# Patient Record
Sex: Male | Born: 1985 | Race: White | Hispanic: No | Marital: Single | Smoking: Current every day smoker
Health system: Southern US, Community
[De-identification: ages and names within clinical notes are randomized; demographics above are authoritative.]

## PROBLEM LIST (undated history)

## (undated) HISTORY — PX: PATELLA FRACTURE SURGERY: SHX735

## (undated) HISTORY — PX: SKIN GRAFT: SHX250

---

## 2016-12-13 DIAGNOSIS — K7011 Alcoholic hepatitis with ascites: Secondary | ICD-10-CM | POA: Insufficient documentation

## 2016-12-16 DIAGNOSIS — F1020 Alcohol dependence, uncomplicated: Secondary | ICD-10-CM | POA: Insufficient documentation

## 2018-05-23 ENCOUNTER — Emergency Department
Admission: EM | Admit: 2018-05-23 | Discharge: 2018-05-23 | Disposition: A | Discharge status: Home / Self Care | Payer: 59 | Source: Home / Self Care

## 2018-05-23 ENCOUNTER — Encounter: Payer: Self-pay | Admitting: Family Medicine

## 2018-05-23 ENCOUNTER — Ambulatory Visit (INDEPENDENT_AMBULATORY_CARE_PROVIDER_SITE_OTHER): Payer: 59

## 2018-05-23 ENCOUNTER — Emergency Department: Payer: 59

## 2018-05-23 ENCOUNTER — Other Ambulatory Visit: Payer: Self-pay

## 2018-05-23 ENCOUNTER — Other Ambulatory Visit (HOSPITAL_BASED_OUTPATIENT_CLINIC_OR_DEPARTMENT_OTHER): Payer: Self-pay | Admitting: Family Medicine

## 2018-05-23 ENCOUNTER — Other Ambulatory Visit: Payer: Self-pay | Admitting: Family Medicine

## 2018-05-23 DIAGNOSIS — N5089 Other specified disorders of the male genital organs: Secondary | ICD-10-CM | POA: Diagnosis not present

## 2018-05-23 DIAGNOSIS — N50812 Left testicular pain: Secondary | ICD-10-CM | POA: Diagnosis not present

## 2018-05-23 DIAGNOSIS — N50819 Testicular pain, unspecified: Secondary | ICD-10-CM

## 2018-05-23 LAB — POCT URINALYSIS DIP (MANUAL ENTRY)
Bilirubin, UA: NEGATIVE
Blood, UA: NEGATIVE
Glucose, UA: NEGATIVE mg/dL
Ketones, POC UA: NEGATIVE mg/dL
Leukocytes, UA: NEGATIVE
Nitrite, UA: NEGATIVE
Protein Ur, POC: NEGATIVE mg/dL
Spec Grav, UA: 1.015 (ref 1.010–1.025)
Urobilinogen, UA: 0.2 E.U./dL
pH, UA: 7.5 (ref 5.0–8.0)

## 2018-05-23 NOTE — Discharge Instructions (Addendum)
You need to be seen today by urologist.  My advice is to go to either the emergency room at [facility] or the emergency department at [facility] in [place].  We cannot tell whether this is a tumor or an infection in the testicle, but either way you need further evaluation and consideration for surgery.  This will get worse if you do not get treatment today.

## 2018-05-23 NOTE — ED Provider Notes (Signed)
Arthur Shelton C[facility]    CSN: [id number] Arrival date & time: 05/23/18  1306     History   Chief Complaint Chief Complaint  Patient presents with  . Testicle Pain    HPI Arthur Shelton is a 33 y.o. male.   This is a 33 year old man making his initial visit to [facility] urgent care.  Pt has had left testicular pain for about 5 days.  Spread to left lower back and radiated down left thigh.  Denies concern for STD.  He does have some back pain bilaterally now.  He was tested for STDs 3 months ago and nothing came back positive.  He has had no sex since then.  Patient is unemployed currently.  He has not been lifting anything heavy.  Patient has a history of alcoholism.     History reviewed. No pertinent past medical history.  There are no active problems to display for this patient.   History reviewed. No pertinent surgical history.     Home Medications    Prior to Admission medications   Medication Sig Start Date End Date Taking? Authorizing Provider  mirtazapine (REMERON) 15 MG tablet TAKE HALF A TABLET BY MOUTH EVREY NIGHT AT BEDTIME 12/11/17  Yes [provider]  naltrexone (DEPADE) 50 MG tablet Take by mouth. 11/27/17  Yes [provider]  omeprazole (PRILOSEC) 20 MG capsule Take by mouth daily. 04/29/18   [provider]  sertraline (ZOLOFT) 50 MG tablet Take 50 mg by mouth daily. 03/20/18   [provider]    Family History Family History  Problem Relation Age of Onset  . Kidney Stones Father   . Kidney Stones Sister     Social History Social History   Tobacco Use  . Smoking status: Not on file  Substance Use Topics  . Alcohol use: Not on file  . Drug use: Not on file     Allergies   Patient has no allergy information on record.   Review of Systems Review of Systems  Gastrointestinal: Negative for abdominal pain.  Genitourinary: Positive for flank pain and testicular pain. Negative for decreased urine  volume, difficulty urinating, discharge, dysuria, enuresis, frequency, genital sores, hematuria, penile pain, penile swelling, scrotal swelling and urgency.  Musculoskeletal: Positive for back pain.  All other systems reviewed and are negative.    Physical Exam Triage Vital Signs ED Triage Vitals  Enc Vitals Group     BP      Pulse      Resp      Temp      Temp src      SpO2      Weight      Height      Head Circumference      Peak Flow      Pain Score      Pain Loc      Pain Edu?      Excl. in [place]?    No data found.  Updated Vital Signs BP (!) 144/82 (BP Location: Right Arm)   Pulse 71   Temp 98 F (36.7 C) (Oral)   Resp 18   Ht 6\' 4"  (1.93 m)   Wt 87.5 kg   SpO2 100%   BMI 23.49 kg/m    Physical Exam Vitals signs and nursing note reviewed.  Constitutional:      Appearance: Normal appearance.  HENT:     Head: Normocephalic.     Mouth/Throat:     Mouth: Mucous membranes are  moist.  Eyes:     Conjunctiva/sclera: Conjunctivae normal.  Cardiovascular:     Rate and Rhythm: Normal rate and regular rhythm.  Pulmonary:     Effort: Pulmonary effort is normal.  Abdominal:     General: Abdomen is flat.     Palpations: Abdomen is soft.     Tenderness: There is no abdominal tenderness.     Hernia: No hernia is present.  Genitourinary:    Penis: Normal.      Comments: Exquisitely tender left testicle, particularly the anterior surface.  There is no swelling of either testicle.  There is no hernia.  There is no inguinal adenopathy. Musculoskeletal: Normal range of motion.        General: No swelling, tenderness, deformity or signs of injury.     Right lower leg: No edema.     Left lower leg: No edema.     Comments: Range of motion of back is normal  Skin:    General: Skin is warm and dry.  Neurological:     General: No focal deficit present.     Mental Status: He is alert and oriented to person, place, and time.  Psychiatric:        Mood and Affect: Mood  normal.      UC Treatments / Results  Labs (all labs ordered are listed, but only abnormal results are displayed) Labs Reviewed  POCT URINALYSIS DIP (MANUAL ENTRY)    EKG None  Radiology U/S of left testicle shows highly vascular mass. Procedures Procedures (including critical care time)  Medications Ordered in UC Medications - No data to display  Initial Impression / Assessment and Plan / UC Course  I have reviewed the triage vital signs and the nursing notes.  Pertinent labs & imaging results that were available during my care of the patient were reviewed by me and considered in my medical decision making (see chart for details).    Final Clinical Impressions(s) / UC Diagnoses   Final diagnoses:  Testicular mass     Discharge Instructions     You need to be seen today by urologist.  My advice is to go to either the emergency room at [facility] or the emergency department at [facility] in [place].  We cannot tell whether this is a tumor or an infection in the testicle, but either way you need further evaluation and consideration for surgery.  This will get worse if you do not get treatment today.    ED Prescriptions    None     Controlled Substance Prescriptions [place] Controlled Substance Registry consulted? Yes, I have consulted the  Controlled Substances Registry for this patient, and feel the risk/benefit ratio today is favorable for proceeding with this prescription for a controlled substance.   Robyn Haber, MD 05/23/18 1431

## 2018-05-23 NOTE — ED Triage Notes (Signed)
Pt has had testicular pain for about 5 days.  Spread to lower back and radiated down left thigh.  Denies concern for STD

## 2018-05-24 ENCOUNTER — Telehealth: Payer: Self-pay

## 2018-05-24 NOTE — Telephone Encounter (Signed)
Spoke with patient, he went to [facility] last night, and has a follow up with urology today.

## 2018-05-28 DIAGNOSIS — C6292 Malignant neoplasm of left testis, unspecified whether descended or undescended: Secondary | ICD-10-CM | POA: Insufficient documentation

## 2018-06-24 ENCOUNTER — Inpatient Hospital Stay (HOSPITAL_COMMUNITY)
Admission: EM | Admit: 2018-06-24 | Discharge: 2018-06-27 | DRG: 897 | Disposition: A | Discharge status: Home / Self Care | Payer: 59 | Attending: Internal Medicine | Admitting: Internal Medicine

## 2018-06-24 ENCOUNTER — Encounter (HOSPITAL_COMMUNITY): Payer: Self-pay | Admitting: Emergency Medicine

## 2018-06-24 DIAGNOSIS — F10230 Alcohol dependence with withdrawal, uncomplicated: Secondary | ICD-10-CM

## 2018-06-24 DIAGNOSIS — K7210 Chronic hepatic failure without coma: Secondary | ICD-10-CM | POA: Diagnosis present

## 2018-06-24 DIAGNOSIS — K709 Alcoholic liver disease, unspecified: Secondary | ICD-10-CM | POA: Diagnosis present

## 2018-06-24 DIAGNOSIS — F419 Anxiety disorder, unspecified: Secondary | ICD-10-CM | POA: Diagnosis not present

## 2018-06-24 DIAGNOSIS — D6959 Other secondary thrombocytopenia: Secondary | ICD-10-CM | POA: Diagnosis present

## 2018-06-24 DIAGNOSIS — Z8547 Personal history of malignant neoplasm of testis: Secondary | ICD-10-CM | POA: Insufficient documentation

## 2018-06-24 DIAGNOSIS — Z79899 Other long term (current) drug therapy: Secondary | ICD-10-CM

## 2018-06-24 DIAGNOSIS — Z841 Family history of disorders of kidney and ureter: Secondary | ICD-10-CM

## 2018-06-24 DIAGNOSIS — E876 Hypokalemia: Secondary | ICD-10-CM

## 2018-06-24 DIAGNOSIS — R7989 Other specified abnormal findings of blood chemistry: Secondary | ICD-10-CM | POA: Diagnosis present

## 2018-06-24 DIAGNOSIS — C6290 Malignant neoplasm of unspecified testis, unspecified whether descended or undescended: Secondary | ICD-10-CM | POA: Diagnosis present

## 2018-06-24 DIAGNOSIS — F10231 Alcohol dependence with withdrawal delirium: Principal | ICD-10-CM | POA: Diagnosis present

## 2018-06-24 DIAGNOSIS — R945 Abnormal results of liver function studies: Secondary | ICD-10-CM | POA: Diagnosis not present

## 2018-06-24 DIAGNOSIS — F10939 Alcohol use, unspecified with withdrawal, unspecified: Secondary | ICD-10-CM | POA: Diagnosis present

## 2018-06-24 DIAGNOSIS — F10239 Alcohol dependence with withdrawal, unspecified: Secondary | ICD-10-CM | POA: Diagnosis present

## 2018-06-24 DIAGNOSIS — K900 Celiac disease: Secondary | ICD-10-CM | POA: Diagnosis present

## 2018-06-24 DIAGNOSIS — K7030 Alcoholic cirrhosis of liver without ascites: Secondary | ICD-10-CM | POA: Diagnosis present

## 2018-06-24 DIAGNOSIS — K766 Portal hypertension: Secondary | ICD-10-CM | POA: Diagnosis present

## 2018-06-24 DIAGNOSIS — F10930 Alcohol use, unspecified with withdrawal, uncomplicated: Secondary | ICD-10-CM

## 2018-06-24 DIAGNOSIS — Z9079 Acquired absence of other genital organ(s): Secondary | ICD-10-CM

## 2018-06-24 LAB — COMPREHENSIVE METABOLIC PANEL
ALT: 100 U/L — ABNORMAL HIGH (ref 0–44)
AST: 231 U/L — ABNORMAL HIGH (ref 15–41)
Albumin: 4.1 g/dL (ref 3.5–5.0)
Alkaline Phosphatase: 145 U/L — ABNORMAL HIGH (ref 38–126)
Anion gap: 11 (ref 5–15)
BUN: 5 mg/dL — AB (ref 6–20)
CO2: 27 mmol/L (ref 22–32)
Calcium: 9.1 mg/dL (ref 8.9–10.3)
Chloride: 98 mmol/L (ref 98–111)
Creatinine, Ser: 0.52 mg/dL — ABNORMAL LOW (ref 0.61–1.24)
GFR calc non Af Amer: >60 mL/min (ref >60)
Glucose, Bld: 103 mg/dL — ABNORMAL HIGH (ref 70–99)
Potassium: 3.4 mmol/L — ABNORMAL LOW (ref 3.5–5.1)
Sodium: 136 mmol/L (ref 135–145)
Total Bilirubin: 2.9 mg/dL — ABNORMAL HIGH (ref 0.3–1.2)
Total Protein: 7.8 g/dL (ref 6.5–8.1)

## 2018-06-24 LAB — CBC
HCT: 39.5 % (ref 39.0–52.0)
Hemoglobin: 13.6 g/dL (ref 13.0–17.0)
MCH: 32.6 pg (ref 26.0–34.0)
MCHC: 34.4 g/dL (ref 30.0–36.0)
MCV: 94.7 fL (ref 80.0–100.0)
Platelets: 80 10*3/uL — ABNORMAL LOW (ref 150–400)
RBC: 4.17 MIL/uL — ABNORMAL LOW (ref 4.22–5.81)
RDW: 13.4 % (ref 11.5–15.5)
WBC: 7 10*3/uL (ref 4.0–10.5)
nRBC: 0 % (ref 0.0–0.2)

## 2018-06-24 LAB — MAGNESIUM: Magnesium: 1.4 mg/dL — ABNORMAL LOW (ref 1.7–2.4)

## 2018-06-24 LAB — ETHANOL: Alcohol, Ethyl (B): <10 mg/dL (ref <10)

## 2018-06-24 MED ORDER — LORAZEPAM 1 MG PO TABS
0.0000 mg | ORAL_TABLET | Freq: Four times a day (QID) | ORAL | Status: AC
  Administered 2018-06-24 (×2): 2 mg via ORAL
  Filled 2018-06-24 (×2): qty 2

## 2018-06-24 MED ORDER — VITAMIN B-1 100 MG PO TABS
100.0000 mg | ORAL_TABLET | Freq: Every day | ORAL | Status: DC
  Administered 2018-06-24 – 2018-06-27 (×4): 100 mg via ORAL
  Filled 2018-06-24 (×4): qty 1

## 2018-06-24 MED ORDER — SODIUM CHLORIDE 0.9 % IV SOLN
INTRAVENOUS | Status: DC
  Administered 2018-06-24: 09:00:00 via INTRAVENOUS

## 2018-06-24 MED ORDER — ONDANSETRON HCL 4 MG PO TABS: 4.0000 mg | ORAL_TABLET | Freq: Four times a day (QID) | ORAL | Status: DC | PRN

## 2018-06-24 MED ORDER — POTASSIUM CHLORIDE CRYS ER 20 MEQ PO TBCR
20.0000 meq | EXTENDED_RELEASE_TABLET | Freq: Two times a day (BID) | ORAL | Status: DC
  Administered 2018-06-24 – 2018-06-27 (×7): 20 meq via ORAL
  Filled 2018-06-24 (×7): qty 1

## 2018-06-24 MED ORDER — LORAZEPAM 2 MG/ML IJ SOLN: 0.0000 mg | Freq: Two times a day (BID) | INTRAMUSCULAR | Status: DC

## 2018-06-24 MED ORDER — ONDANSETRON HCL 4 MG/2ML IJ SOLN: 4.0000 mg | Freq: Four times a day (QID) | INTRAMUSCULAR | Status: DC | PRN

## 2018-06-24 MED ORDER — DIAZEPAM 5 MG PO TABS
5.0000 mg | ORAL_TABLET | Freq: Four times a day (QID) | ORAL | Status: DC
  Administered 2018-06-24 – 2018-06-25 (×5): 5 mg via ORAL
  Filled 2018-06-24 (×5): qty 1

## 2018-06-24 MED ORDER — LORAZEPAM 1 MG PO TABS: 0.0000 mg | ORAL_TABLET | Freq: Two times a day (BID) | ORAL | Status: DC

## 2018-06-24 MED ORDER — LORAZEPAM 2 MG/ML IJ SOLN
0.0000 mg | Freq: Four times a day (QID) | INTRAMUSCULAR | Status: AC
  Administered 2018-06-25: 2 mg via INTRAVENOUS
  Filled 2018-06-24 (×2): qty 1

## 2018-06-24 MED ORDER — THIAMINE HCL 100 MG/ML IJ SOLN
100.0000 mg | Freq: Every day | INTRAMUSCULAR | Status: DC
  Filled 2018-06-24: qty 2

## 2018-06-24 MED ORDER — LOPERAMIDE HCL 2 MG PO CAPS
2.0000 mg | ORAL_CAPSULE | Freq: Four times a day (QID) | ORAL | Status: DC | PRN
  Administered 2018-06-24 – 2018-06-27 (×2): 2 mg via ORAL
  Filled 2018-06-24 (×2): qty 1

## 2018-06-24 NOTE — ED Notes (Signed)
[facility] called for update.  Pt gave RN verbal permission to update facility which RN did.

## 2018-06-24 NOTE — ED Provider Notes (Addendum)
[place] EMERGENCY DEPARTMENT Provider Note   CSN: [id number] Arrival date & time: 06/24/18  0309    History   Chief Complaint Chief Complaint  Patient presents with  . Withdrawal    HPI Arthur Shelton is a 33 y.o. male.     33 y.o male with no pertinent no PMH presents to the ED sent from [facility] for possible withdrawals. Patient's last drink was Sunday 02/23 around 2pm states he had a glass of wine, prior to that patient had a bottle and a half of liquor throughout a week. He reports enrolling at [facility] yesterday began to experience some shakiness along with abdominal pain and states he was given Librium twice with his last dose at 9pm. Patient reports he began experience auditory and visual hallucinations and was told by RN on staff that he needed to be sent to the ED. Patient denies any SI, HI, chest pain.          Home Medications    Prior to Admission medications   Medication Sig Start Date End Date Taking? Authorizing Provider  mirtazapine (REMERON) 15 MG tablet TAKE HALF A TABLET BY MOUTH EVREY NIGHT AT BEDTIME 12/11/17   [provider]  naltrexone (DEPADE) 50 MG tablet Take by mouth. 11/27/17   [provider]  omeprazole (PRILOSEC) 20 MG capsule Take by mouth daily. 04/29/18   [provider]  sertraline (ZOLOFT) 50 MG tablet Take 50 mg by mouth daily. 03/20/18   [provider]    Family History Family History  Problem Relation Age of Onset  . Kidney Stones Father   . Kidney Stones Sister     Social History Social History   Tobacco Use  . Smoking status: Not on file  Substance Use Topics  . Alcohol use: Not on file  . Drug use: Not on file     Allergies   Patient has no known allergies.   Review of Systems Review of Systems  Constitutional: Negative for chills and fever.  HENT: Negative for ear pain and sore throat.   Eyes: Negative for pain and visual disturbance.    Respiratory: Negative for cough and shortness of breath.   Cardiovascular: Negative for chest pain and palpitations.  Gastrointestinal: Positive for abdominal pain. Negative for vomiting.  Genitourinary: Negative for dysuria and hematuria.  Musculoskeletal: Negative for arthralgias and back pain.  Skin: Negative for color change and rash.  Neurological: Negative for seizures and syncope.  Psychiatric/Behavioral: The patient is nervous/anxious.   All other systems reviewed and are negative.    Physical Exam Updated Vital Signs BP 137/88   Pulse 70   Temp 98.7 F (37.1 C) (Oral)   Resp 16   Ht 6\' 4"  (1.93 m)   Wt 90.7 kg   SpO2 97%   BMI 24.34 kg/m   Physical Exam Vitals signs and nursing note reviewed.  Constitutional:      Appearance: He is well-developed.  HENT:     Head: Normocephalic and atraumatic.  Eyes:     General: No scleral icterus.    Pupils: Pupils are equal, round, and reactive to light.  Neck:     Musculoskeletal: Normal range of motion.  Cardiovascular:     Heart sounds: Normal heart sounds.  Pulmonary:     Effort: Pulmonary effort is normal.     Breath sounds: Normal breath sounds. No wheezing.  Chest:     Chest wall: No tenderness.  Abdominal:  General: Bowel sounds are normal. There is no distension.     Palpations: Abdomen is soft.     Tenderness: There is abdominal tenderness.     Comments: Abdominal tenderness along the incision site.   Musculoskeletal:        General: No tenderness or deformity.  Skin:    General: Skin is warm and dry.  Neurological:     Mental Status: He is alert and oriented to person, place, and time.      ED Treatments / Results  Labs (all labs ordered are listed, but only abnormal results are displayed) Labs Reviewed  COMPREHENSIVE METABOLIC PANEL - Abnormal; Notable for the following components:      Result Value   Potassium 3.4 (*)    Glucose, Bld 103 (*)    BUN 5 (*)    Creatinine, Ser 0.52 (*)     AST 231 (*)    ALT 100 (*)    Alkaline Phosphatase 145 (*)    Total Bilirubin 2.9 (*)    All other components within normal limits  CBC - Abnormal; Notable for the following components:   RBC 4.17 (*)    Platelets 80 (*)    All other components within normal limits  ETHANOL  RAPID URINE DRUG SCREEN, HOSP PERFORMED    EKG None  Radiology No results found.  Procedures Procedures (including critical care time)  Medications Ordered in ED Medications  LORazepam (ATIVAN) injection 0-4 mg ( Intravenous See Alternative 06/24/18 0420)    Or  LORazepam (ATIVAN) tablet 0-4 mg (2 mg Oral Given 06/24/18 0420)  LORazepam (ATIVAN) injection 0-4 mg (has no administration in time range)    Or  LORazepam (ATIVAN) tablet 0-4 mg (has no administration in time range)  thiamine (VITAMIN B-1) tablet 100 mg (has no administration in time range)    Or  thiamine (B-1) injection 100 mg (has no administration in time range)     Initial Impression / Assessment and Plan / ED Course  I have reviewed the triage vital signs and the nursing notes.  Pertinent labs & imaging results that were available during my care of the patient were reviewed by me and considered in my medical decision making (see chart for details).      Patient presents from [facility] attempted to seek treatment on Sunday 23. Patient began experiencing some abdominal pain, sweats, visual and auditory hallucinations. He was given Librium x 2 but due to his symptoms persisting was sent to ED for stabilization.  During evaluation patient endorses some hallucinations along with headache and abdominal pain. CIWA score calculated at 12. Given Ativan x 2 oral, patient was moved to hallway bed but symptoms have not improved. He appears more anxious although hallucinations have resolved. He reports a previous admission due to alcohol withdrawal.   CMP showed slight decrease in K+. LFTs are elevated AST> ALT. CBC showed no leukocytosis,  hemoglobin is within normal limits. Ethanol level undetected.    5:58 AM Due to patient persist of symptoms will place call for hospitalist admission due alcohol withdrawal.   6:29 AM Spoke to Dr. Hal Hope who will have patient admitted to incoming provider.   Final Clinical Impressions(s) / ED Diagnoses   Final diagnoses:  Alcohol withdrawal syndrome without complication [facility])    ED Discharge Orders    None       Janeece Fitting, PA-C 06/24/18 0630    Orpah Greek, MD 06/24/18 2671    Janeece Fitting, PA-C 07/09/18  2337    Orpah Greek, MD 07/10/18 [phone]

## 2018-06-24 NOTE — ED Triage Notes (Signed)
Pt from [facility], last drink was 1pm on Sunday.  Pt is experiencing withdrawals and needed stabilization per facility.  Symptoms include headache, nausea, abdominal pain, chills, sweats and hallucinations.

## 2018-06-24 NOTE — ED Notes (Signed)
Pt continues to show signs of withdrawals, tremors, anxious and generally poor feeling. Pt moved back into room and provider notified and bedside.

## 2018-06-24 NOTE — H&P (Signed)
History and Physical    Arthur Shelton EYC:[id number] DOB: May 27, 1985 DOA: 06/24/2018  PCP: Patient, No Pcp Per  Patient coming from: [facility]   I have personally briefly reviewed patient's old medical records available.   Chief Complaint: hallucination  HPI: Arthur Shelton is a 33 y.o. 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  Patient has alcoholism for more than 5 years.  He drinks Liquor rum 3-5 drinks every day and more than that.  He had tried some rehab in the past.  [place].  By evening he started having hallucination that could not be managed there so he was sent to the ER.  According to the patient, he was having slight tremors and hallucination was most bothersome.  He was seeing things not there.  After arrival to ER he was given high-dose Ativan and currently feels normal.  Denies any nausea vomiting.  Denies any abdominal pain.  Denies any cramp.  Denies any headache, dizziness, syncopal episode.  Bowel and urine habits are normal.  No fever. ED Course: Hemodynamically stable.  Electrolytes normal except potassium of 3.4.  LFTs abnormal but at his baseline.  Patient was slightly agitated and hallucinating in the ER, he was given Ativan and currently Calm and quiet.  Hospitalization requested because of very high risk of withdrawals and delirium tremens, stabilization before sending back to [facility].  Review of Systems: As per HPI otherwise 10 point review of systems negative.    History reviewed. No pertinent past medical history.  Anxiety depression Alcoholic liver disease and abnormal liver function test Testicular  cancer  History reviewed. No pertinent surgical history. Radical orchiectomy left on 05/28/2018.  Social history Denies smoking 3-5 and occasionally more shots of rum every day for more than 5 years No Known Allergies  Family History  Problem Relation Age of Onset  . Kidney Stones Father   . Kidney Stones Sister      Prior to Admission medications   Medication Sig Start Date End Date Taking? Authorizing Provider  mirtazapine (REMERON) 15 MG tablet TAKE HALF A TABLET BY MOUTH EVREY NIGHT AT BEDTIME 12/11/17   [provider]  naltrexone (DEPADE) 50 MG tablet Take by mouth. 11/27/17   [provider]  omeprazole (PRILOSEC) 20 MG capsule Take by mouth daily. 04/29/18   [provider]  sertraline (ZOLOFT) 50 MG tablet Take 50 mg by mouth daily. 03/20/18   [provider]    Physical Exam: Vitals:   06/24/18 0600 06/24/18 0615 06/24/18 0630 06/24/18 0645  BP: 140/86 137/88 140/83 124/90  Pulse: 72 70 78 70  Resp:      Temp:      TempSrc:      SpO2: 97% 97% 100% 97%  Weight:      Height:        Constitutional: NAD, calm, comfortable Vitals:   06/24/18 0600 06/24/18 0615 06/24/18 0630 06/24/18 0645  BP: 140/86 137/88 140/83 124/90  Pulse: 72 70 78 70  Resp:      Temp:      TempSrc:      SpO2: 97% 97% 100% 97%  Weight:      Height:       Eyes: PERRL, lids and conjunctivae normal ENMT:  Mucous membranes are moist. Posterior pharynx clear of any exudate or lesions.Normal dentition.  Neck: normal, supple, no masses, no thyromegaly Respiratory: clear to auscultation bilaterally, no wheezing, no crackles. Normal respiratory effort. No accessory muscle use.  Cardiovascular: Regular rate and rhythm, no murmurs / rubs / gallops. No extremity edema. 2+ pedal pulses. No carotid bruits.  Abdomen: no tenderness, no masses palpated. No hepatosplenomegaly. Bowel sounds positive.  Musculoskeletal: no clubbing / cyanosis. No joint deformity upper and  lower extremities. Good ROM, no contractures. Normal muscle tone.  Skin: no rashes, lesions, ulcers. No induration Neurologic: CN 2-12 grossly intact. Sensation intact, DTR normal. Strength 5/5 in all 4.  Psychiatric: Normal judgment and insight. Alert and oriented x 3. Normal mood.  Currently denies any hallucinations, delusions.  Denies any suicidal or homicidal ideation.   Labs on Admission: I have personally reviewed following labs and imaging studies  CBC: Recent Labs  Lab 06/24/18 0318  WBC 7.0  HGB 13.6  HCT 39.5  MCV 94.7  PLT 80*   Basic Metabolic Panel: Recent Labs  Lab 06/24/18 0318  NA 136  K 3.4*  CL 98  CO2 27  GLUCOSE 103*  BUN 5*  CREATININE 0.52*  CALCIUM 9.1   GFR: Estimated Creatinine Clearance: 165.8 mL/min (A) (by C-G formula based on SCr of 0.52 mg/dL (L)). Liver Function Tests: Recent Labs  Lab 06/24/18 0318  AST 231*  ALT 100*  ALKPHOS 145*  BILITOT 2.9*  PROT 7.8  ALBUMIN 4.1   No results for input(s): LIPASE, AMYLASE in the last 168 hours. No results for input(s): AMMONIA in the last 168 hours. Coagulation Profile: No results for input(s): INR, PROTIME in the last 168 hours. Cardiac Enzymes: No results for input(s): CKTOTAL, CKMB, CKMBINDEX, TROPONINI in the last 168 hours. BNP (last 3 results) No results for input(s): PROBNP in the last 8760 hours. HbA1C: No results for input(s): HGBA1C in the last 72 hours. CBG: No results for input(s): GLUCAP in the last 168 hours. Lipid Profile: No results for input(s): CHOL, HDL, LDLCALC, TRIG, CHOLHDL, LDLDIRECT in the last 72 hours. Thyroid Function Tests: No results for input(s): TSH, T4TOTAL, FREET4, T3FREE, THYROIDAB in the last 72 hours. Anemia Panel: No results for input(s): VITAMINB12, FOLATE, FERRITIN, TIBC, IRON, RETICCTPCT in the last 72 hours. Urine analysis:    Component Value Date/Time   BILIRUBINUR negative 05/23/2018 1330   KETONESUR negative 05/23/2018 1330   PROTEINUR  negative 05/23/2018 1330   UROBILINOGEN 0.2 05/23/2018 1330   NITRITE Negative 05/23/2018 1330   LEUKOCYTESUR Negative 05/23/2018 1330    Radiological Exams on Admission: No results found.    Assessment/Plan Principal Problem:   Alcohol withdrawal (HCC) Active Problems:   Hypokalemia   Abnormal LFTs   Alcoholic liver disease (HCC)   Celiac disease   Anxiety     1. Alcohol withdrawal with high risk of delirium tremens: Agree with admission given severity of symptoms.  Patient is very high risk of developing delirium tremens.  Patient will be started on aggressive benzodiazepine therapy with IV and oral Ativan.  Multivitamins and thiamine.  Will start patient on scheduled doses of benzodiazepine with hope to taper every day that will help reduce the dose of as needed medications.  Fall precautions.  Seizure precautions.  2.  Hypokalemia: Replace and monitor levels.  Will check magnesium.  3.  Chronic liver disease/chronic alcoholic liver disease/abnormal LFTs: At about his baseline.  Currently no evidence of decompensation.  He was followed by GI  at [facility].  4.  Testicular cancer: Status post left radical orchiectomy.  Followed by urology at [facility].  He will follow-up with them.  Currently improved as per patient.   DVT prophylaxis: SCDs Code Status: Full code Family Communication: No family at bedside Disposition Plan: Back to [facility] Consults called: None Admission status: Observation telemetry   Barb Merino MD [facility] Pager [phone]  If 7PM-7AM, please contact night-coverage [email or web address] Password [facility]  06/24/2018, 7:41 AM

## 2018-06-25 DIAGNOSIS — K709 Alcoholic liver disease, unspecified: Secondary | ICD-10-CM | POA: Diagnosis not present

## 2018-06-25 DIAGNOSIS — F10230 Alcohol dependence with withdrawal, uncomplicated: Secondary | ICD-10-CM | POA: Diagnosis not present

## 2018-06-25 DIAGNOSIS — R945 Abnormal results of liver function studies: Secondary | ICD-10-CM | POA: Diagnosis not present

## 2018-06-25 DIAGNOSIS — F10231 Alcohol dependence with withdrawal delirium: Secondary | ICD-10-CM | POA: Diagnosis not present

## 2018-06-25 LAB — COMPREHENSIVE METABOLIC PANEL
ALT: 93 U/L — ABNORMAL HIGH (ref 0–44)
AST: 194 U/L — ABNORMAL HIGH (ref 15–41)
Albumin: 3.4 g/dL — ABNORMAL LOW (ref 3.5–5.0)
Alkaline Phosphatase: 128 U/L — ABNORMAL HIGH (ref 38–126)
Anion gap: 9 (ref 5–15)
BUN: 5 mg/dL — ABNORMAL LOW (ref 6–20)
CO2: 25 mmol/L (ref 22–32)
Calcium: 9 mg/dL (ref 8.9–10.3)
Chloride: 104 mmol/L (ref 98–111)
Creatinine, Ser: 0.56 mg/dL — ABNORMAL LOW (ref 0.61–1.24)
GFR calc Af Amer: >60 mL/min (ref >60)
GFR calc non Af Amer: >60 mL/min (ref >60)
Glucose, Bld: 85 mg/dL (ref 70–99)
Potassium: 3.5 mmol/L (ref 3.5–5.1)
SODIUM: 138 mmol/L (ref 135–145)
Total Bilirubin: 3.4 mg/dL — ABNORMAL HIGH (ref 0.3–1.2)
Total Protein: 6.6 g/dL (ref 6.5–8.1)

## 2018-06-25 LAB — CBC
HCT: 35.3 % — ABNORMAL LOW (ref 39.0–52.0)
Hemoglobin: 12 g/dL — ABNORMAL LOW (ref 13.0–17.0)
MCH: 32 pg (ref 26.0–34.0)
MCHC: 34 g/dL (ref 30.0–36.0)
MCV: 94.1 fL (ref 80.0–100.0)
Platelets: 62 10*3/uL — ABNORMAL LOW (ref 150–400)
RBC: 3.75 MIL/uL — ABNORMAL LOW (ref 4.22–5.81)
RDW: 13.2 % (ref 11.5–15.5)
WBC: 4.2 10*3/uL (ref 4.0–10.5)
nRBC: 0 % (ref 0.0–0.2)

## 2018-06-25 LAB — PROTIME-INR
INR: 1.4 — ABNORMAL HIGH (ref 0.8–1.2)
Prothrombin Time: 17.2 seconds — ABNORMAL HIGH (ref 11.4–15.2)

## 2018-06-25 LAB — HIV ANTIBODY (ROUTINE TESTING W REFLEX): HIV Screen 4th Generation wRfx: NONREACTIVE

## 2018-06-25 LAB — AMMONIA: Ammonia: 55 umol/L — ABNORMAL HIGH (ref 9–35)

## 2018-06-25 MED ORDER — MAGNESIUM SULFATE 2 GM/50ML IV SOLN
2.0000 g | Freq: Once | INTRAVENOUS | Status: AC
  Administered 2018-06-26: 2 g via INTRAVENOUS
  Filled 2018-06-25: qty 50

## 2018-06-25 MED ORDER — HEPARIN SODIUM (PORCINE) 5000 UNIT/ML IJ SOLN
5000.0000 [IU] | Freq: Three times a day (TID) | INTRAMUSCULAR | Status: DC
  Administered 2018-06-25: 5000 [IU] via SUBCUTANEOUS
  Filled 2018-06-25 (×2): qty 1

## 2018-06-25 MED ORDER — SODIUM CHLORIDE 0.9 % IV SOLN: INTRAVENOUS | Status: DC

## 2018-06-25 MED ORDER — POTASSIUM CHLORIDE 2 MEQ/ML IV SOLN
INTRAVENOUS | Status: AC
  Administered 2018-06-25 – 2018-06-26 (×3): via INTRAVENOUS
  Filled 2018-06-25 (×6): qty 1000

## 2018-06-25 MED ORDER — CHLORDIAZEPOXIDE HCL 5 MG PO CAPS
10.0000 mg | ORAL_CAPSULE | Freq: Three times a day (TID) | ORAL | Status: DC
  Administered 2018-06-25 – 2018-06-26 (×4): 10 mg via ORAL
  Filled 2018-06-25 (×4): qty 2

## 2018-06-25 MED ORDER — LACTULOSE 10 GM/15ML PO SOLN
10.0000 g | Freq: Two times a day (BID) | ORAL | Status: DC
  Administered 2018-06-25 – 2018-06-26 (×3): 10 g via ORAL
  Filled 2018-06-25 (×5): qty 15

## 2018-06-25 NOTE — Progress Notes (Signed)
PROGRESS NOTE                                                                                                                                                                                                             Patient Demographics:    Arthur Shelton, is a 33 y.o. male, DOB - [birth year], BJS:[id number]  Admit date - 06/24/2018   Admitting Physician Rise Patience, MD  Outpatient Primary MD for the patient is Patient, No Pcp Per  LOS - 0  Chief Complaint  Patient presents with  . Withdrawal       Brief Narrative  Arthur Shelton is a 33 y.o. male with medical history significant of chronic alcoholism, alcoholic liver disease, chronic liver failure, portal hypertension previously requiring paracentesis, anxiety, recently diagnosed testicular cancer status post radical orchidectomy last month coming from a rehab center with hallucinations after not drinking alcohol for 24 hours, was diagnosed with DTs.   Subjective:    Zaion Hreha today has, No headache, No chest pain, No abdominal pain - No Nausea, No new weakness tingling or numbness, No Cough - SOB.     Assessment  & Plan :    Principal Problem:   Alcohol withdrawal ([place]) Active Problems:   Hypokalemia   Abnormal LFTs   Alcoholic liver disease (HCC)   Celiac disease   Anxiety   1. DTs - has drink on Sunday 2 days ago, currently placed on long-acting benzodiazepine along with CIWA protocol, counseled to quit, DTs have stabilized but still remains high risk for withdrawal over the next 2 days.  Continue gentle hydration monitor electrolytes.  He wants to go back to [facility] once DTs have resolved.  This facility cannot take the patient while he is in DTs.  2.  History of alcoholic cirrhosis.  Outpatient GI follow-up, counseled to quit, ammonia is borderline elevated will place him on lactulose, INR is mildly high suggesting some underlying liver dysfunction which appears to be mild at this  time.  3.  Testicular cancer.  Status post recent left radial orchiectomy.  Outpatient follow-up with urology at [facility] post discharge.  4.  Hypomagnesemia.  Replaced will monitor.    Family Communication  :  None  Code Status :  Full  [facility] Plan  [facility]  [facility] resolved  Consults  :  None  Procedures  :    DVT Prophylaxis  :  Heparin    Lab Results  Component Value Date   PLT 62 (L) 06/25/2018    Diet :  Diet Order            Diet regular Room service appropriate? Yes; Fluid consistency: Thin  Diet effective now               Inpatient Medications Scheduled Meds: . chlordiazePOXIDE  10 mg Oral TID  . lactulose  10 g Oral BID  . LORazepam  0-4 mg Intravenous Q6H   Or  . LORazepam  0-4 mg Oral Q6H  . [START ON 06/26/2018] LORazepam  0-4 mg Intravenous Q12H   Or  . [START ON 06/26/2018] LORazepam  0-4 mg Oral Q12H  . potassium chloride  20 mEq Oral BID  . thiamine  100 mg Oral Daily   Or  . thiamine  100 mg Intravenous Daily   Continuous Infusions: . sodium chloride    . magnesium sulfate 1 - 4 g bolus IVPB     PRN Meds:.loperamide, ondansetron **OR** ondansetron (ZOFRAN) IV  Antibiotics  :   Anti-infectives (From admission, onward)   None          Objective:   Vitals:   06/24/18 0818 06/24/18 1549 06/24/18 2243 06/25/18 0511  BP: 136/84 (!) 146/86 (!) 146/88 (!) 141/94  Pulse: 69 84 92 86  Resp: 18 18    Temp: 98.4 F (36.9 C) 98.4 F (36.9 C) 98.2 F (36.8 C) 97.6 F (36.4 C)  TempSrc: Oral  Oral Oral  SpO2: 97% 98% 99% 99%  Weight: 86.5 kg     Height: 6\' 4"  (1.93 m)       Wt Readings from Last 3 Encounters:  06/24/18 86.5 kg  05/23/18 87.5 kg     Intake/Output Summary (Last 24 hours) at 06/25/2018 0841 Last data filed at 06/24/2018 1211 Gross per 24 hour  Intake 120 ml  Output -  Net 120 ml     Physical Exam  Awake Alert, Oriented X 3, No new F.N deficits, Normal affect [place].AT,PERRAL Supple  Neck,No JVD, No cervical lymphadenopathy appriciated.  Symmetrical Chest wall movement, Good air movement bilaterally, CTAB RRR,No Gallops,Rubs or new Murmurs, No Parasternal Heave +ve B.Sounds, Abd Soft, No tenderness, No organomegaly appriciated, No rebound - guarding or rigidity. No Cyanosis, Clubbing or edema, No new Rash or bruise      Data Review:    CBC Recent Labs  Lab 06/24/18 0318 06/25/18 0508  WBC 7.0 4.2  HGB 13.6 12.0*  HCT 39.5 35.3*  PLT 80* 62*  MCV 94.7 94.1  MCH 32.6 32.0  MCHC 34.4 34.0  RDW 13.4 13.2    Chemistries  Recent Labs  Lab 06/24/18 0318 06/24/18 1004 06/25/18 0508  NA 136  --  138  K 3.4*  --  3.5  CL 98  --  104  CO2 27  --  25  GLUCOSE 103*  --  85  BUN 5*  --  5*  CREATININE 0.52*  --  0.56*  CALCIUM 9.1  --  9.0  MG  --  1.4*  --   AST 231*  --  194*  ALT 100*  --  93*  ALKPHOS 145*  --  128*  BILITOT 2.9*  --  3.4*   ------------------------------------------------------------------------------------------------------------------ No results for input(s): CHOL, HDL, LDLCALC, TRIG, CHOLHDL, LDLDIRECT in the last 72 hours.  No results found for: HGBA1C ------------------------------------------------------------------------------------------------------------------ No results for input(s): TSH, T4TOTAL, T3FREE, THYROIDAB in the last 72 hours.  Invalid input(s): FREET3 ------------------------------------------------------------------------------------------------------------------  No results for input(s): VITAMINB12, FOLATE, FERRITIN, TIBC, IRON, RETICCTPCT in the last 72 hours.  Coagulation profile Recent Labs  Lab 06/25/18 0645  INR 1.4*    No results for input(s): DDIMER in the last 72 hours.  Cardiac Enzymes No results for input(s): CKMB, TROPONINI, MYOGLOBIN in the last 168 hours.  Invalid input(s):  CK ------------------------------------------------------------------------------------------------------------------ No results found for: BNP  Micro Results No results found for this or any previous visit (from the past 240 hour(s)).  Radiology Reports No results found.  Time Spent in minutes  30   Lala Lund M.D on 06/25/2018 at 8:41 AM  To page go to [email or web address] - password [facility]

## 2018-06-25 NOTE — Progress Notes (Signed)
Patient is supposed to go back to [facility] after discharge.  Mom will be taking him from hospital to [place].  However, FH will still require a nurse to nurse report, please call report to [place] before d/c.  Also FH would like for [place] to send a summary of his treatment during hospital stay for they're records.  Thank you!

## 2018-06-25 NOTE — Evaluation (Signed)
Physical Therapy Evaluation and D/C Patient Details Name: Arthur Shelton MRN: [id number] DOB: 1985/06/01 Today's Date: 06/25/2018   History of Present Illness  Earmon Sherrow is a 33 y.o. male with medical history significant of chronic alcoholism, alcoholic liver disease, chronic liver failure, portal hypertension previously requiring paracentesis, anxiety, recently diagnosed testicular cancer status post radical orchidectomy last month coming from a rehab center with hallucinations after not drinking alcohol for 24 hours, was diagnosed with DTs.  Clinical Impression  Pt admitted with above diagnosis. Pt currently without significant functional limitations and was able to ambulate without assist and without device.  Scored 21/24 on DGI therefore not high fall risk.  Pt did not have any LOB with mod challenges. Will not need  skilled PT at this time. Will sign off.    Follow Up Recommendations No PT follow up    Equipment Recommendations  None recommended by PT    Recommendations for Other Services       Precautions / Restrictions Precautions Precautions: None Restrictions Weight Bearing Restrictions: No      Mobility  Bed Mobility Overal bed mobility: Independent                Transfers Overall transfer level: Independent                  Ambulation/Gait Ambulation/Gait assistance: Independent Gait Distance (Feet): 650 Feet Assistive device: None Gait Pattern/deviations: WFL(Within Functional Limits)   Gait velocity interpretation: >4.37 ft/sec, indicative of normal walking speed General Gait Details: No LOB with challenges.  Pt right LE at times did not take as big a step with right foot but did not cause any imbalance.   Stairs Stairs: Yes Stairs assistance: Modified independent (Device/Increase time) Stair Management: One rail Left;Forwards;Alternating pattern Number of Stairs: 4 General stair comments: No issues up and down steps  Wheelchair Mobility    Modified Rankin (Stroke Patients Only)       Balance Overall balance assessment: Needs assistance Sitting-balance support: No upper extremity supported;Feet supported Sitting balance-Leahy Scale: Fair     Standing balance support: No upper extremity supported;During functional activity Standing balance-Leahy Scale: Good                   Standardized Balance Assessment Standardized Balance Assessment : Dynamic Gait Index   Dynamic Gait Index Level Surface: Normal Change in Gait Speed: Normal Gait with Horizontal Head Turns: Mild Impairment Gait with Vertical Head Turns: Normal Gait and Pivot Turn: Mild Impairment Step Over Obstacle: Normal Step Around Obstacles: Normal Steps: Mild Impairment Total Score: 21       Pertinent Vitals/Pain Pain Assessment: No/denies pain    Home Living Family/patient expects to be discharged to:: Private residence Living Arrangements: Other (Comment)([facility]) Available Help at Discharge: Available PRN/intermittently Type of Home: Other(Comment)([facility]) Home Access: Stairs to enter Entrance Stairs-Rails: Psychiatric nurse of Steps: 4 Home Layout: One level Home Equipment: None      Prior Function Level of Independence: Independent               Hand Dominance        Extremity/Trunk Assessment   Upper Extremity Assessment Upper Extremity Assessment: Defer to OT evaluation    Lower Extremity Assessment Lower Extremity Assessment: Generalized weakness    Cervical / Trunk Assessment Cervical / Trunk Assessment: Normal  Communication   Communication: No difficulties  Cognition Arousal/Alertness: Awake/alert Behavior During Therapy: WFL for tasks assessed/performed Overall Cognitive Status: Within Functional  Limits for tasks assessed                                        General Comments      Exercises     Assessment/Plan    PT  Assessment Patent does not need any further PT services  PT Problem List         PT Treatment Interventions      PT Goals (Current goals can be found in the Care Plan section)  Acute Rehab PT Goals Patient Stated Goal: to go to [facility] to continue treatment PT Goal Formulation: All assessment and education complete, DC therapy    Frequency     Barriers to discharge        Co-evaluation               AM-PAC PT "6 Clicks" Mobility  Outcome Measure Help needed turning from your back to your side while in a flat bed without using bedrails?: None Help needed moving from lying on your back to sitting on the side of a flat bed without using bedrails?: None Help needed moving to and from a bed to a chair (including a wheelchair)?: None Help needed standing up from a chair using your arms (e.g., wheelchair or bedside chair)?: None Help needed to walk in hospital room?: None Help needed climbing 3-5 steps with a railing? : None 6 Click Score: 24    End of Session Equipment Utilized During Treatment: Gait belt Activity Tolerance: Patient tolerated treatment well Patient left: in bed;with call bell/phone within reach;with nursing/sitter in room Nurse Communication: Mobility status PT Visit Diagnosis: Muscle weakness (generalized) (M62.81)    Time: 9371-6967 PT Time Calculation (min) (ACUTE ONLY): 26 min   Charges:   PT Evaluation $PT Eval Moderate Complexity: 1 Mod PT Treatments $Gait Training: 8-22 mins        [place] Pager:  [phone]  Office:  [place] 06/25/2018, 10:41 AM

## 2018-06-26 DIAGNOSIS — K7030 Alcoholic cirrhosis of liver without ascites: Secondary | ICD-10-CM | POA: Diagnosis present

## 2018-06-26 DIAGNOSIS — K709 Alcoholic liver disease, unspecified: Secondary | ICD-10-CM | POA: Diagnosis not present

## 2018-06-26 DIAGNOSIS — Z841 Family history of disorders of kidney and ureter: Secondary | ICD-10-CM | POA: Diagnosis not present

## 2018-06-26 DIAGNOSIS — E876 Hypokalemia: Secondary | ICD-10-CM | POA: Diagnosis present

## 2018-06-26 DIAGNOSIS — K900 Celiac disease: Secondary | ICD-10-CM | POA: Diagnosis present

## 2018-06-26 DIAGNOSIS — Z79899 Other long term (current) drug therapy: Secondary | ICD-10-CM | POA: Diagnosis not present

## 2018-06-26 DIAGNOSIS — K766 Portal hypertension: Secondary | ICD-10-CM | POA: Diagnosis present

## 2018-06-26 DIAGNOSIS — Z9079 Acquired absence of other genital organ(s): Secondary | ICD-10-CM | POA: Diagnosis not present

## 2018-06-26 DIAGNOSIS — F10231 Alcohol dependence with withdrawal delirium: Secondary | ICD-10-CM | POA: Diagnosis present

## 2018-06-26 DIAGNOSIS — K7210 Chronic hepatic failure without coma: Secondary | ICD-10-CM | POA: Diagnosis present

## 2018-06-26 DIAGNOSIS — F419 Anxiety disorder, unspecified: Secondary | ICD-10-CM | POA: Diagnosis present

## 2018-06-26 DIAGNOSIS — D6959 Other secondary thrombocytopenia: Secondary | ICD-10-CM | POA: Diagnosis present

## 2018-06-26 DIAGNOSIS — D696 Thrombocytopenia, unspecified: Secondary | ICD-10-CM

## 2018-06-26 DIAGNOSIS — F10230 Alcohol dependence with withdrawal, uncomplicated: Secondary | ICD-10-CM | POA: Diagnosis present

## 2018-06-26 DIAGNOSIS — C6290 Malignant neoplasm of unspecified testis, unspecified whether descended or undescended: Secondary | ICD-10-CM | POA: Diagnosis present

## 2018-06-26 LAB — COMPREHENSIVE METABOLIC PANEL
ALT: 98 U/L — ABNORMAL HIGH (ref 0–44)
AST: 202 U/L — ABNORMAL HIGH (ref 15–41)
Albumin: 3.5 g/dL (ref 3.5–5.0)
Alkaline Phosphatase: 127 U/L — ABNORMAL HIGH (ref 38–126)
Anion gap: 11 (ref 5–15)
BILIRUBIN TOTAL: 2.4 mg/dL — AB (ref 0.3–1.2)
BUN: <5 mg/dL — ABNORMAL LOW (ref 6–20)
CO2: 23 mmol/L (ref 22–32)
CREATININE: 0.56 mg/dL — AB (ref 0.61–1.24)
Calcium: 8.9 mg/dL (ref 8.9–10.3)
Chloride: 102 mmol/L (ref 98–111)
GFR calc Af Amer: >60 mL/min (ref >60)
GFR calc non Af Amer: >60 mL/min (ref >60)
Glucose, Bld: 84 mg/dL (ref 70–99)
Potassium: 4 mmol/L (ref 3.5–5.1)
Sodium: 136 mmol/L (ref 135–145)
Total Protein: 6.8 g/dL (ref 6.5–8.1)

## 2018-06-26 LAB — MAGNESIUM: Magnesium: 2.1 mg/dL (ref 1.7–2.4)

## 2018-06-26 LAB — CBC
HCT: 34.8 % — ABNORMAL LOW (ref 39.0–52.0)
Hemoglobin: 12 g/dL — ABNORMAL LOW (ref 13.0–17.0)
MCH: 32.6 pg (ref 26.0–34.0)
MCHC: 34.5 g/dL (ref 30.0–36.0)
MCV: 94.6 fL (ref 80.0–100.0)
Platelets: 53 10*3/uL — ABNORMAL LOW (ref 150–400)
RBC: 3.68 MIL/uL — AB (ref 4.22–5.81)
RDW: 13.4 % (ref 11.5–15.5)
WBC: 4.7 10*3/uL (ref 4.0–10.5)
nRBC: 0 % (ref 0.0–0.2)

## 2018-06-26 MED ORDER — CHLORDIAZEPOXIDE HCL 5 MG PO CAPS
10.0000 mg | ORAL_CAPSULE | Freq: Two times a day (BID) | ORAL | Status: DC
  Administered 2018-06-26 – 2018-06-27 (×2): 10 mg via ORAL
  Filled 2018-06-26 (×2): qty 2

## 2018-06-26 MED ORDER — ADULT MULTIVITAMIN W/MINERALS CH
1.0000 | ORAL_TABLET | Freq: Every day | ORAL | Status: DC
  Administered 2018-06-26 – 2018-06-27 (×2): 1 via ORAL
  Filled 2018-06-26 (×2): qty 1

## 2018-06-26 MED ORDER — FOLIC ACID 1 MG PO TABS
1.0000 mg | ORAL_TABLET | Freq: Every day | ORAL | Status: DC
  Administered 2018-06-26 – 2018-06-27 (×2): 1 mg via ORAL
  Filled 2018-06-26 (×2): qty 1

## 2018-06-26 NOTE — Progress Notes (Signed)
Patient ID: Arthur Shelton, male   DOB: 1985-08-19, 33 y.o.   MRN: [id number]  PROGRESS NOTE    Arthur Shelton  VEH:[id number] DOB: Mar 13, 1986 DOA: 06/24/2018 PCP: Patient, No Pcp Per   Brief Narrative:  33 year old male with history of alcoholism, active liver disease, portal hypertension previously requiring paracentesis, anxiety, recently diagnosed testicular cancer status post radical orchidectomy last month presented from rehab center with hallucinations after not drinking alcohol for 24 hours and was diagnosed with DTs.  Assessment & Plan:   Principal Problem:   Alcohol withdrawal ([place]) Active Problems:   Hypokalemia   Abnormal LFTs   Alcoholic liver disease ([place])   Celiac disease   Anxiety   Delirium tremens/alcohol withdrawal in a patient with chronic alcohol abuse -Continue CIWA protocol.  Counseled to abstain from alcohol.  Continue current regimen.  Will add folic acid and multivitamin.  Probable discharge to [facility] detox center tomorrow if he remains clinically stable  History of alcoholic liver disease/cirrhosis -Ammonia level was borderline elevated.  Lactulose has been started.  Currently not on diuretics.  Outpatient follow-up with GI.  Testicular cancer status post recent left radical orchiectomy -Outpatient follow-up with urology at [facility] post discharge  Thrombocytopenia -From alcohol abuse and cirrhosis.  No signs of bleeding.  Monitor  Hypomagnesemia -Replaced.  Resolved.   DVT prophylaxis: Discontinue heparin because of thrombocytopenia.  Will start SCDs Code Status: Full Family Communication: None at bedside Disposition Plan: Discharge tomorrow to [facility] if remains stable  Consultants: None  Procedures: None  Antimicrobials: None   Subjective: Patient seen and examined at bedside.  He feels slightly better.  His tremors are improving.  No current hallucinations.  No overnight fever or vomiting.  Objective: Vitals:   06/25/18 1412  06/25/18 1413 06/25/18 2214 06/26/18 0551  BP:  135/88 (!) 137/92 128/83  Pulse:  92 79 79  Resp:  16 16 18   Temp: 97.9 F (36.6 C) 97.9 F (36.6 C) 98 F (36.7 C) (!) 97.4 F (36.3 C)  TempSrc: Oral  Oral   SpO2:  98% 99% 99%  Weight:      Height:        Intake/Output Summary (Last 24 hours) at 06/26/2018 1101 Last data filed at 06/26/2018 1044 Gross per 24 hour  Intake 360 ml  Output -  Net 360 ml   Filed Weights   06/24/18 0316 06/24/18 0818  Weight: 90.7 kg 86.5 kg    Examination:  General exam: Appears calm and comfortable, no distress. Respiratory system: Bilateral decreased breath sounds at bases Cardiovascular system: S1 & S2 heard, Rate controlled Gastrointestinal system: Abdomen is nondistended, soft and nontender. Normal bowel sounds heard. Extremities: No cyanosis, clubbing, edema   Data Reviewed: I have personally reviewed following labs and imaging studies  CBC: Recent Labs  Lab 06/24/18 0318 06/25/18 0508 06/26/18 0344  WBC 7.0 4.2 4.7  HGB 13.6 12.0* 12.0*  HCT 39.5 35.3* 34.8*  MCV 94.7 94.1 94.6  PLT 80* 62* 53*   Basic Metabolic Panel: Recent Labs  Lab 06/24/18 0318 06/24/18 1004 06/25/18 0508 06/26/18 0344  NA 136  --  138 136  K 3.4*  --  3.5 4.0  CL 98  --  104 102  CO2 27  --  25 23  GLUCOSE 103*  --  85 84  BUN 5*  --  5* <5*  CREATININE 0.52*  --  0.56* 0.56*  CALCIUM 9.1  --  9.0 8.9  MG  --  1.4*  --  2.1   GFR: Estimated Creatinine Clearance: 165.2 mL/min (A) (by C-G formula based on SCr of 0.56 mg/dL (L)). Liver Function Tests: Recent Labs  Lab 06/24/18 0318 06/25/18 0508 06/26/18 0344  AST 231* 194* 202*  ALT 100* 93* 98*  ALKPHOS 145* 128* 127*  BILITOT 2.9* 3.4* 2.4*  PROT 7.8 6.6 6.8  ALBUMIN 4.1 3.4* 3.5   No results for input(s): LIPASE, AMYLASE in the last 168 hours. Recent Labs  Lab 06/25/18 0645  AMMONIA 55*   Coagulation Profile: Recent Labs  Lab 06/25/18 0645  INR 1.4*   Cardiac  Enzymes: No results for input(s): CKTOTAL, CKMB, CKMBINDEX, TROPONINI in the last 168 hours. BNP (last 3 results) No results for input(s): PROBNP in the last 8760 hours. HbA1C: No results for input(s): HGBA1C in the last 72 hours. CBG: No results for input(s): GLUCAP in the last 168 hours. Lipid Profile: No results for input(s): CHOL, HDL, LDLCALC, TRIG, CHOLHDL, LDLDIRECT in the last 72 hours. Thyroid Function Tests: No results for input(s): TSH, T4TOTAL, FREET4, T3FREE, THYROIDAB in the last 72 hours. Anemia Panel: No results for input(s): VITAMINB12, FOLATE, FERRITIN, TIBC, IRON, RETICCTPCT in the last 72 hours. Sepsis Labs: No results for input(s): PROCALCITON, LATICACIDVEN in the last 168 hours.  No results found for this or any previous visit (from the past 240 hour(s)).       Radiology Studies: No results found.      Scheduled Meds: . chlordiazePOXIDE  10 mg Oral TID  . heparin injection (subcutaneous)  5,000 Units Subcutaneous Q8H  . lactulose  10 g Oral BID  . LORazepam  0-4 mg Intravenous Q12H   Or  . LORazepam  0-4 mg Oral Q12H  . potassium chloride  20 mEq Oral BID  . thiamine  100 mg Oral Daily   Or  . thiamine  100 mg Intravenous Daily   Continuous Infusions: . lactated ringers with kcl 75 mL/hr at 06/26/18 0021     LOS: 0 days        Aline August, MD [facility] 06/26/2018, 11:01 AM

## 2018-06-26 NOTE — Progress Notes (Signed)
PT Cancellation Note  Patient Details Name: Arthur Shelton MRN: [id number] DOB: 1986-03-15   Cancelled Treatment:    Reason Eval/Treat Not Completed: PT screened, no needs identified, will sign off. New PT eval received, chart reviewed. Note that pt was evaluated by PT 2/25 and did not require any further acute PT needs. Please see full evaluation for details. If needs change, please reconsult.   Thelma Comp 06/26/2018, 7:42 AM   Rolinda Roan, PT, DPT Acute Rehabilitation Services Pager: [phone] Office: [phone]

## 2018-06-26 NOTE — Care Management Note (Addendum)
Case Management Note  Patient Details  Name: Case Vassell MRN: [id number] Date of Birth: 05-Feb-1986  Subjective/Objective:  Admitted with alcohol withdrawal. Hx of   chronic alcoholism, alcoholic liver disease, chronic liver failure, portal hypertension,  paracentesis, anxiety,  testicular cancer status post radical orchidectomy. PTA  Independent with ADL's., no DME usage.        Jarod Bozzo (Mother)     [phone]      PCP: pt without PCP  Action/Plan: Transition back to [facility]. when medically stable.... CSW aware. No present needs identified per NCM.  Post hospital f/u appointment scheduled and noted on AVS per NCM. Pt states has transportation to [facility] once d/c.  Expected Discharge Date:                  Expected Discharge Plan:  Home/Self Care  In-House Referral:  Clinical Social Work  Discharge planning Services  CM Consult(home health needs)  Post Acute Care Choice:  NA Choice offered to:  NA  DME Arranged:  N/A DME Agency:  NA  HH Arranged:  NA HH Agency:  NA  Status of Service:  inprocess  If discussed at Long Length of Stay Meetings, dates discussed:    Additional Comments:  Sharin Mons, RN 06/26/2018, 3:28 PM

## 2018-06-27 LAB — CBC
HEMATOCRIT: 35.1 % — AB (ref 39.0–52.0)
Hemoglobin: 12.1 g/dL — ABNORMAL LOW (ref 13.0–17.0)
MCH: 32.7 pg (ref 26.0–34.0)
MCHC: 34.5 g/dL (ref 30.0–36.0)
MCV: 94.9 fL (ref 80.0–100.0)
Platelets: 71 10*3/uL — ABNORMAL LOW (ref 150–400)
RBC: 3.7 MIL/uL — ABNORMAL LOW (ref 4.22–5.81)
RDW: 13.4 % (ref 11.5–15.5)
WBC: 5.9 10*3/uL (ref 4.0–10.5)
nRBC: 0 % (ref 0.0–0.2)

## 2018-06-27 LAB — COMPREHENSIVE METABOLIC PANEL
ALT: 111 U/L — ABNORMAL HIGH (ref 0–44)
AST: 171 U/L — ABNORMAL HIGH (ref 15–41)
Albumin: 3.6 g/dL (ref 3.5–5.0)
Alkaline Phosphatase: 129 U/L — ABNORMAL HIGH (ref 38–126)
Anion gap: 12 (ref 5–15)
BILIRUBIN TOTAL: 1.4 mg/dL — AB (ref 0.3–1.2)
BUN: 7 mg/dL (ref 6–20)
CO2: 23 mmol/L (ref 22–32)
Calcium: 9 mg/dL (ref 8.9–10.3)
Chloride: 101 mmol/L (ref 98–111)
Creatinine, Ser: 0.58 mg/dL — ABNORMAL LOW (ref 0.61–1.24)
GFR calc Af Amer: >60 mL/min (ref >60)
Glucose, Bld: 98 mg/dL (ref 70–99)
Potassium: 3.8 mmol/L (ref 3.5–5.1)
Sodium: 136 mmol/L (ref 135–145)
Total Protein: 6.5 g/dL (ref 6.5–8.1)

## 2018-06-27 LAB — MAGNESIUM: MAGNESIUM: 1.8 mg/dL (ref 1.7–2.4)

## 2018-06-27 MED ORDER — LACTULOSE 10 GM/15ML PO SOLN: 10.0000 g | Freq: Two times a day (BID) | ORAL | 0 refills | Status: AC

## 2018-06-27 MED ORDER — FOLIC ACID 1 MG PO TABS: 1.0000 mg | ORAL_TABLET | Freq: Every day | ORAL | 0 refills | Status: AC

## 2018-06-27 MED ORDER — THIAMINE HCL 100 MG PO TABS: 100.0000 mg | ORAL_TABLET | Freq: Every day | ORAL | 0 refills | Status: AC

## 2018-06-27 MED ORDER — ADULT MULTIVITAMIN W/MINERALS CH: 1.0000 | ORAL_TABLET | Freq: Every day | ORAL | Status: AC

## 2018-06-27 NOTE — Progress Notes (Signed)
Arthur Shelton to be D/C'd  To [facility] per MD order.  Discussed with the patient and mother Condron,Constance  and all questions fully answered.pt mother will [facility].   VSS, Skin clean, dry and intact without evidence of skin break down, no evidence of skin tears noted. IV catheter discontinued intact. Site without signs and symptoms of complications. Dressing and pressure applied.  An After Visit Summary was printed and given to the patient. Patient received prescription.  D/c education completed with patient/family including follow up instructions, medication list, d/c activities limitations if indicated, with other d/c instructions as indicated by MD - patient able to verbalize understanding, all questions fully answered.   Patient instructed to return to ED, call 911, or call MD for any changes in condition.   Patient escorted by his  Mother [place],Constance , and D[facility]C to [facility] auto. [facility] Nurse notify and report given.    Lorenza Evangelist [facility] 06/27/2018 1:43 PM

## 2018-06-27 NOTE — Progress Notes (Addendum)
CSW faxed DC Summary as requested to [facility] [phone])  Cedric Fishman LCSW [phone]

## 2018-06-27 NOTE — Discharge Summary (Signed)
Physician Discharge Summary  Arthur Shelton XIP:[id number] DOB: January 04, 1986 DOA: 06/24/2018  PCP: Patient, No Pcp Per  Admit date: 06/24/2018 Discharge date: 06/27/2018  Admitted From: Home Disposition:  Home  Recommendations for Outpatient Follow-up:  1. Follow up with PCP in 1 week with repeat CBC/CMP 2. Follow-up with gastroenterology as an outpatient 3. Follow-up with outpatient alcohol detoxification center 4. 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  His symptoms improved during the hospitalization.  He is stable for discharge.  He will be discharged and he plans to check into outpatient alcohol detoxification center today.  Discharge Diagnoses:  Principal Problem:   Alcohol withdrawal ([place]) Active Problems:   Hypokalemia   Abnormal LFTs   Alcoholic liver disease ([place])   Celiac disease   Anxiety  Delirium tremens/alcohol withdrawal in a patient with chronic alcohol abuse -Currently on CIWA protocol.  Counseled to abstain from alcohol.    Symptoms has much improved. -Continue multivitamin, thiamine and folic acid -We will discharge patient home.  Patient plans to check into outpatient alcohol detoxification center today.    History of alcoholic liver disease/cirrhosis -Ammonia level was borderline elevated.  Lactulose has been started.  Currently not on diuretics.  Outpatient follow-up with GI.  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  No signs of bleeding.  Monitor  Hypomagnesemia -Replaced.  Resolved.  Discharge Instructions  Discharge Instructions    Ambulatory referral to Gastroenterology   Complete by:  As directed    Alcoholic liver disease   What is the reason for referral?:  Other   Diet general   Complete by:  As directed    Increase activity slowly   Complete by:  As directed      Allergies as of 06/27/2018   No Known Allergies     Medication List    STOP taking these medications   chlordiazePOXIDE 10 MG capsule Commonly known as:  LIBRIUM   diazepam 5 MG/ML injection Commonly known as:  VALIUM     TAKE these medications   anti-nausea solution Take 10 mLs by mouth every 15 (fifteen) minutes as needed for nausea or vomiting.   folic acid 1 MG tablet Commonly known as:  FOLVITE Take 1 tablet (1 mg total) by mouth daily. Start taking on:  June 28, 2018   lactulose 10 GM/15ML solution Commonly known as:  CHRONULAC Take 15 mLs (10 g total) by mouth 2 (two) times daily.   Melatonin 3 MG Caps Take 3 mg by mouth at bedtime.   mirtazapine 15 MG tablet Commonly known as:  REMERON Take 15 mg by mouth at bedtime.   multivitamin with minerals Tabs tablet Take 1 tablet by mouth daily. Start taking on:  June 28, 2018   ondansetron 8 MG tablet Commonly known as:  ZOFRAN Take by mouth every 8 (eight) hours as needed for nausea or vomiting.   propranolol 10 MG tablet Commonly known as:  INDERAL Take 10 mg by mouth 3 (three) times daily.   thiamine 100 MG tablet Take  1 tablet (100 mg total) by mouth daily. Start taking on:  June 28, 2018      Follow-up Information    [facility]. Go on 07/22/2018.   Why:  Post hospital follow scheduled for 07/22/2018 at 9am with Dr. Ferdinand Lango information: [place] [id number] [phone]          No Known Allergies  Consultations:  None   Procedures/Studies:  No results found.    Subjective: Patient seen and examined at bedside.  He denies any overnight fever, nausea, vomiting or hallucinations.  He feels okay to be discharged.  Discharge Exam: Vitals:   06/26/18 2237 06/27/18 0702  BP: (!) 143/86 124/81  Pulse: 80 76  Resp: 19 16  Temp: 98.2 F (36.8 C) 98.4 F (36.9 C)  SpO2: 99% 99%   Vitals:   06/26/18 0551 06/26/18 1434 06/26/18 2237 06/27/18 0702  BP: 128/83 140/83 (!) 143/86 124/81  Pulse: 79 78 80 76  Resp: 18 15 19 16   Temp: (!) 97.4 F (36.3 C) 98.6 F (37 C) 98.2 F (36.8 C) 98.4 F (36.9 C)  TempSrc:  Oral Oral Oral  SpO2: 99% 99% 99% 99%  Weight:      Height:        General: Pt is alert, awake, not in acute distress Cardiovascular: rate controlled, S1/S2 + Respiratory: bilateral decreased breath sounds at bases Abdominal: Soft, NT, ND, bowel sounds + Extremities: no edema, no cyanosis    The results of significant diagnostics from this hospitalization (including imaging, microbiology, ancillary and laboratory) are listed below for reference.     Microbiology: No results found for this or any previous visit (from the past 240 hour(s)).   Labs: BNP (last 3 results) No results for input(s): BNP in the last 8760 hours. Basic Metabolic Panel: Recent Labs  Lab 06/24/18 0318 06/24/18 1004 06/25/18 0508 06/26/18 0344 06/27/18 0527  NA 136  --  138 136 136  K 3.4*  --  3.5 4.0 3.8  CL 98  --  104 102 101  CO2 27  --  25 23 23   GLUCOSE 103*  --  85 84 98  BUN 5*  --  5* <5* 7  CREATININE 0.52*  --  0.56* 0.56* 0.58*  CALCIUM 9.1  --  9.0 8.9 9.0  MG  --  1.4*  --  2.1 1.8   Liver Function Tests: Recent Labs  Lab 06/24/18 0318 06/25/18 0508 06/26/18 0344 06/27/18 0527  AST 231* 194* 202* 171*  ALT 100* 93* 98* 111*  ALKPHOS 145* 128* 127* 129*  BILITOT 2.9* 3.4* 2.4* 1.4*  PROT 7.8 6.6 6.8 6.5  ALBUMIN 4.1 3.4*  3.5 3.6   No results for input(s): LIPASE, AMYLASE in the last 168 hours. Recent Labs  Lab 06/25/18 0645  AMMONIA 55*   CBC: Recent Labs  Lab 06/24/18 0318 06/25/18 0508 06/26/18 0344 06/27/18 0527  WBC 7.0 4.2 4.7 5.9  HGB 13.6 12.0* 12.0* 12.1*  HCT 39.5 35.3* 34.8* 35.1*  MCV 94.7 94.1 94.6 94.9  PLT 80* 62* 53* 71*   Cardiac Enzymes: No results for input(s): CKTOTAL, CKMB, CKMBINDEX, TROPONINI in the last 168 hours. BNP: Invalid input(s): POCBNP CBG: No results for input(s): GLUCAP in the last 168 hours. D-Dimer No results for input(s): DDIMER in the last 72 hours. Hgb A1c No results for input(s): HGBA1C in the last 72 hours. Lipid Profile No results for input(s): CHOL, HDL,  LDLCALC, TRIG, CHOLHDL, LDLDIRECT in the last 72 hours. Thyroid function studies No results for input(s): TSH, T4TOTAL, T3FREE, THYROIDAB in the last 72 hours.  Invalid input(s): FREET3 Anemia work up No results for input(s): VITAMINB12, FOLATE, FERRITIN, TIBC, IRON, RETICCTPCT in the last 72 hours. Urinalysis    Component Value Date/Time   BILIRUBINUR negative 05/23/2018 1330   KETONESUR negative 05/23/2018 1330   PROTEINUR negative 05/23/2018 1330   UROBILINOGEN 0.2 05/23/2018 1330   NITRITE Negative 05/23/2018 1330   LEUKOCYTESUR Negative 05/23/2018 1330   Sepsis Labs Invalid input(s): PROCALCITONIN,  WBC,  LACTICIDVEN Microbiology No results found for this or any previous visit (from the past 240 hour(s)).   Time coordinating discharge: 35 minutes  SIGNED:   Aline August, MD  [facility] 06/27/2018, 9:30 AM

## 2018-06-28 LAB — TB SKIN TEST
Induration: 5 mm
TB Skin Test: NEGATIVE

## 2018-07-21 NOTE — Progress Notes (Signed)
Subjective:    Patient ID: Arthur Shelton, male    DOB: [birth year], 33 y.o.   MRN: [id number]  32 y.o.M who was hospitalized between the 24th and 27 February for alcohol withdrawal symptoms along with acute alcoholic hepatitis.  The patient currently is now a resident at [facility] undergoing alcohol rehab.  The patient was actually in the rehab center when he began having withdrawal symptoms and the rehab center sent the patient to the hospital for care as his medical situation was too complicated for their detox facility.  The patient has a pre-existing history of portal hypertension with liver cirrhosis and chronic ascites.  He had been managed by a gastroenterologist in the [place] area.  He had had previous hospitalizations for ascites and liver disease in the [place] area before coming to the [place] area for his job.  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  The patient's oncologist is in the [facility] system whose office site actually is in [place].  Patient currently is off all alcohol.  There is been no other history of substance use.  The patient does smoke 1 pack daily of cigarettes.  The patient did receive a slow lorazepam taper and finish this 1 March.  The patient was on thiamine and folic acid and he states he is receiving this in combination vitamin tablet at this time.  The patient states he has a longstanding history of major depression and associated anxiety.  Patient currently is taking sertraline 100 mg daily along with ongoing use of lactulose twice daily and mirtazapine at bedtime.  Patient continues to use the omeprazole daily.  Patient also remains on Inderal for his portal hypertension.  The patient had elevated AST and ALT in the hospital and has had follow-up labs done this week results which are pending at [facility].  The patient  does maintain [employer] coverage that he obtained from working at [employer]  The patient does plan to stay in the [place] area after his discharge from [place] which is scheduled for a week from now.  The patient is looking for a new primary care provider in the [place] area.   The patient did state he would like a referral to gastroenterology in the [place] area. He will continue to follow-up with fellowship also outpatient alcohol recovery program upon discharge from the inpatient unit.     Below is last OV with GI MD in [facility] 12/2017 Referred 8-18, in consultation courtesy of Dr. Heidi Dach regarding alcoholic liver disease. He had been hospitalized at [facility] with abdominal pain and distention. He had continued to abuse alcohol. Workup reveals alcoholic liver disease with ascites and portal hypertension. He has required paracentesis  2 in August. Analysis of ascites is consistent with chronic liver disease. He had discontinued alcohol. He was maintained on Lasix and Aldactone. He is currently off diuretics.   Unfortunately he started drinking again [place], 15 drinks a day. He has just been discharged from rehab. Labs reveal alcoholic hepatitis macrocytosis and a very high ammonium level.  We will obtain follow-up abdominal ultrasound. He will maintain multivitamins daily and will start lactulose 30 mils p.o. twice daily to titrate to 2 bowel movements a day. We will repeat his lab work in 6 weeks.  He is seeking care for alcohol addiction.     History reviewed. No pertinent past medical history.  Family History  Problem Relation Age of Onset  . Kidney Stones Father   . Kidney Stones Sister      Social History   Socioeconomic History  . Marital status: Single    Spouse name: Not on file  . Number of children: Not on file  . Years of education: Not on file  . Highest education level: Not on file  Occupational History  . Not on file  Social  Needs  . Financial resource strain: Not on file  . Food insecurity:    Worry: Not on file    Inability: Not on file  . Transportation needs:    Medical: Not on file    Non-medical: Not on file  Tobacco Use  . Smoking status: Current Every Day Smoker    Packs/day: 1.00    Years: 20.00    Pack years: 20.00  Substance and Sexual Activity  . Alcohol use: Not Currently  . Drug use: Never  . Sexual activity: Not on file  Lifestyle  . Physical activity:    Days per week: Not on file    Minutes per session: Not on file  . Stress: Not on file  Relationships  . Social connections:    Talks on phone: Not on file    Gets together: Not on file    Attends religious service: Not on file    Active member of club or organization: Not on file    Attends meetings of clubs or organizations: Not on file    Relationship status: Not on file  . Intimate partner violence:    Fear of current or ex partner: Not on file    Emotionally abused: Not on file    Physically abused: Not on file    Forced sexual activity: Not on file  Other Topics Concern  . Not on file  Social History Narrative  . Not on file     No Known Allergies   Outpatient Medications Prior to Visit  Medication Sig Dispense Refill  . lactulose (CHRONULAC) 10 GM/15ML solution Take 15 mLs (10 g total) by mouth 2 (two) times daily. 236 mL 0  . Melatonin 3 MG CAPS Take 3 mg by mouth at bedtime.    . mirtazapine (REMERON) 15 MG tablet Take 15 mg by mouth at bedtime.     . Multiple Vitamin (MULTIVITAMIN WITH MINERALS) TABS tablet Take 1 tablet by mouth daily.    Marland Kitchen omeprazole (PRILOSEC) 20 MG capsule Take 20 mg by mouth daily.    . propranolol (INDERAL) 10 MG tablet Take 10 mg by mouth 3 (three) times daily.    . sertraline (ZOLOFT) 100 MG tablet Take 100 mg by mouth daily.    . folic acid (FOLVITE) 1 MG tablet Take 1 tablet (1 mg total) by mouth daily. (Patient not taking: Reported on 07/22/2018) 30 tablet 0  . ondansetron (ZOFRAN) 8  MG tablet Take by mouth every 8 (eight) hours as needed for nausea or vomiting.    . thiamine 100 MG tablet Take 1 tablet (100 mg total) by mouth daily. (Patient not taking: Reported on 07/22/2018) 30 tablet 0  . anti-nausea (EMETROL) solution Take 10 mLs by mouth every 15 (fifteen) minutes as needed for nausea or vomiting.     No facility-administered medications prior to visit.       Review of Systems  Constitutional: Positive for appetite change. Negative for fatigue.  HENT: Negative.   Eyes: Negative.   Respiratory: Negative.   Cardiovascular:  Negative.   Gastrointestinal: Negative for abdominal distention, abdominal pain, blood in stool, diarrhea, nausea and vomiting.  Genitourinary: Negative.   Musculoskeletal: Negative.   Skin: Negative.   Neurological: Negative.   Hematological: Negative.   Psychiatric/Behavioral: Positive for dysphoric mood and sleep disturbance. Negative for self-injury and suicidal ideas. The patient is nervous/anxious.        Objective:   Physical Exam Vitals:   07/22/18 0857  BP: 130/73  Pulse: 74  Resp: 16  Temp: 97.9 F (36.6 C)  TempSrc: Oral  SpO2: 96%  Weight: 197 lb 9.6 oz (89.6 kg)  Height: '6\' 4"'  (1.93 m)    Gen: Pleasant, well-nourished, in no distress,  normal affect  ENT: No lesions,  mouth clear,  oropharynx clear, no postnasal drip  Neck: No JVD, no TMG, no carotid bruits  Lungs: No use of accessory muscles, no dullness to percussion, clear without rales or rhonchi  Cardiovascular: RRR, heart sounds normal, no murmur or gallops, no peripheral edema  Abdomen: soft tender in the right upper quadrant with mild hepatomegaly,   BS normal  Musculoskeletal: No deformities, no cyanosis or clubbing  Neuro: alert, non focal  Skin: Warm, no lesions or rashes BMP Latest Ref Rng & Units 06/27/2018 06/26/2018 06/25/2018  Glucose 70 - 99 mg/dL 98 84 85  BUN 6 - 20 mg/dL 7 <5(L) 5(L)  Creatinine 0.61 - 1.24 mg/dL 0.58(L) 0.56(L)  0.56(L)  Sodium 135 - 145 mmol/L 136 136 138  Potassium 3.5 - 5.1 mmol/L 3.8 4.0 3.5  Chloride 98 - 111 mmol/L 101 102 104  CO2 22 - 32 mmol/L '23 23 25  ' Calcium 8.9 - 10.3 mg/dL 9.0 8.9 9.0    CBC Latest Ref Rng & Units 06/27/2018 06/26/2018 06/25/2018  WBC 4.0 - 10.5 K/uL 5.9 4.7 4.2  Hemoglobin 13.0 - 17.0 g/dL 12.1(L) 12.0(L) 12.0(L)  Hematocrit 39.0 - 52.0 % 35.1(L) 34.8(L) 35.3(L)  Platelets 150 - 400 K/uL 71(L) 53(L) 62(L)   Hepatic Function Latest Ref Rng & Units 06/27/2018 06/26/2018 06/25/2018  Total Protein 6.5 - 8.1 g/dL 6.5 6.8 6.6  Albumin 3.5 - 5.0 g/dL 3.6 3.5 3.4(L)  AST 15 - 41 U/L 171(H) 202(H) 194(H)  ALT 0 - 44 U/L 111(H) 98(H) 93(H)  Alk Phosphatase 38 - 126 U/L 129(H) 127(H) 128(H)  Total Bilirubin 0.3 - 1.2 mg/dL 1.4(H) 2.4(H) 3.4(H)  From [place] [facility] 12-15-16,  no esophageal varices.   Ct a/p 8-18, w,wo.  Imaging:   IMPRESSIONS:  1. Findings consistent with liver disease. There is extensive hepatic steatosis with areas of fatty sparing. There is hepatomegaly.  2. Moderate to large volume of ascites.  3. The spleen is at the upper limits of normal for size.  4. Question varices in the perisplenic region and region of the gastrohepatic ligament. This is concerning for portal hypertension.  5. There is pericholecystic fluid. This is nonspecific and is likely due to the patient's liver disease. Correlate for clinical evidence of cholecystitis.      Assessment & Plan:  I personally reviewed all images and lab data in the [facility] system as well as any outside material available during this office visit and agree with the  radiology impressions.   Alcoholic liver disease ([place])  history of recurrent alcoholic liver disease with hepatitis and portal hypertension and associated ascites  Currently the liver is enlarged and slightly tender and there is no evidence of active ascites on current physical exam.  The patient  did have imaging studies  in [place] to suggest portal hypertension was present however an upper endoscopy did not show esophageal varices.  The patient is requesting a gastroenterology consultation in the [place] area and we will make this referral at this visit  In the interim the patient will continue omeprazole 20 mg daily, thiamine 50 mg daily, folic acid 1 mg daily, Inderal 10 mg 3 times daily, and lactulose 15 mL's twice daily  Celiac disease History of celiac disease which currently is in remission  We will ask gastroenterology to reassess  Ascites due to chronic alcoholic hepatitis Ascites due to chronic alcoholic hepatitis which appears to be improved  No indication for repeat paracentesis  Alcohol withdrawal ([place]) History of alcohol withdrawal currently improved and now currently off lorazepam  The patient will receive outpatient alcohol rehab services upon discharge from the inpatient unit this week  History of testicular cancer History of testicular cancer with left orchiectomy  Follow-up will be in the [facility] oncology system  Abnormal LFTs Abnormal liver function test on the basis of alcoholic hepatitis  We have asked for the patient's liver functions which have just been obtained this week in the rehab center we will obtain copies of these results  Severe alcohol use disorder ([place]) As per alcohol withdrawal syndrome this patient has chronic severe alcohol use and is continuing his therapy for same  Tobacco use Ongoing tobacco use, we spoke briefly about this at this visit and will follow this up further  Hypokalemia Hypokalemia, will follow this up with lab data obtained at the rehab center  Severe episode of recurrent major depressive disorder, without psychotic features ([place]) History of severe anxiety and major depression recurrent that has triggered the patient's alcohol use  Note the patient currently is on  sertraline 100 mg daily and will continue this for now   The patient will receive a 30-day supply of medications upon discharge from inpatient rehab and we will see the patient within 3 weeks to ensure there is continuity and refills can be established  An HIV study was obtained today for screening

## 2018-07-22 ENCOUNTER — Other Ambulatory Visit: Payer: Self-pay

## 2018-07-22 ENCOUNTER — Ambulatory Visit: Payer: 59 | Attending: Critical Care Medicine | Admitting: Critical Care Medicine

## 2018-07-22 ENCOUNTER — Encounter: Payer: Self-pay | Admitting: Critical Care Medicine

## 2018-07-22 VITALS — BP 130/73 | HR 74 | Temp 97.9°F | Resp 16 | Ht 76.0 in | Wt 197.6 lb

## 2018-07-22 DIAGNOSIS — Z720 Tobacco use: Secondary | ICD-10-CM | POA: Insufficient documentation

## 2018-07-22 DIAGNOSIS — R7989 Other specified abnormal findings of blood chemistry: Secondary | ICD-10-CM

## 2018-07-22 DIAGNOSIS — R945 Abnormal results of liver function studies: Secondary | ICD-10-CM

## 2018-07-22 DIAGNOSIS — F419 Anxiety disorder, unspecified: Secondary | ICD-10-CM

## 2018-07-22 DIAGNOSIS — F332 Major depressive disorder, recurrent severe without psychotic features: Secondary | ICD-10-CM | POA: Insufficient documentation

## 2018-07-22 DIAGNOSIS — F1020 Alcohol dependence, uncomplicated: Secondary | ICD-10-CM | POA: Diagnosis not present

## 2018-07-22 DIAGNOSIS — F10931 Alcohol use, unspecified with withdrawal delirium: Secondary | ICD-10-CM

## 2018-07-22 DIAGNOSIS — F10231 Alcohol dependence with withdrawal delirium: Secondary | ICD-10-CM

## 2018-07-22 DIAGNOSIS — K709 Alcoholic liver disease, unspecified: Secondary | ICD-10-CM

## 2018-07-22 DIAGNOSIS — K7011 Alcoholic hepatitis with ascites: Secondary | ICD-10-CM

## 2018-07-22 DIAGNOSIS — Z8547 Personal history of malignant neoplasm of testis: Secondary | ICD-10-CM

## 2018-07-22 DIAGNOSIS — E876 Hypokalemia: Secondary | ICD-10-CM

## 2018-07-22 DIAGNOSIS — K900 Celiac disease: Secondary | ICD-10-CM

## 2018-07-22 NOTE — Assessment & Plan Note (Addendum)
History of testicular cancer with left orchiectomy  Follow-up will be in the [facility] oncology system

## 2018-07-22 NOTE — Assessment & Plan Note (Signed)
History of alcohol withdrawal currently improved and now currently off lorazepam  The patient will receive outpatient alcohol rehab services upon discharge from the inpatient unit this week

## 2018-07-22 NOTE — Assessment & Plan Note (Signed)
Ongoing tobacco use, we spoke briefly about this at this visit and will follow this up further

## 2018-07-22 NOTE — Assessment & Plan Note (Signed)
History of celiac disease which currently is in remission  We will ask gastroenterology to reassess

## 2018-07-22 NOTE — Assessment & Plan Note (Signed)
Hypokalemia, will follow this up with lab data obtained at the rehab center

## 2018-07-22 NOTE — Assessment & Plan Note (Signed)
History of severe anxiety and major depression recurrent that has triggered the patient's alcohol use  Note the patient currently is on  sertraline 100 mg daily and will continue this for now  The patient will receive a 30-day supply of medications upon discharge from inpatient rehab and we will see the patient within 3 weeks to ensure there is continuity and refills can be established

## 2018-07-22 NOTE — Assessment & Plan Note (Signed)
Ascites due to chronic alcoholic hepatitis which appears to be improved  No indication for repeat paracentesis

## 2018-07-22 NOTE — Patient Instructions (Signed)
No change in your medications for now  We need to make sure you are getting 100 mg of thiamine and 1 mg of folic acid daily, please take the letter back to your rehab center for communication to the medical director on this  We will have you sign a document so we can get all your labs from the rehab center  A referral to gastroenterology will be made locally for evaluation of your liver disease and follow-up  Please return in 3 weeks here to see Dr. Joya Gaskins for post rehab follow-up to ensure all of your medications can be refilled

## 2018-07-22 NOTE — Assessment & Plan Note (Signed)
As per alcohol withdrawal syndrome this patient has chronic severe alcohol use and is continuing his therapy for same

## 2018-07-22 NOTE — Assessment & Plan Note (Signed)
history of recurrent alcoholic liver disease with hepatitis and portal hypertension and associated ascites  Currently the liver is enlarged and slightly tender and there is no evidence of active ascites on current physical exam.  The patient did have imaging studies in [place] to suggest portal hypertension was present however an upper endoscopy did not show esophageal varices.  The patient is requesting a gastroenterology consultation in the [place] area and we will make this referral at this visit  In the interim the patient will continue omeprazole 20 mg daily, thiamine 50 mg daily, folic acid 1 mg daily, Inderal 10 mg 3 times daily, and lactulose 15 mL's twice daily

## 2018-07-22 NOTE — Assessment & Plan Note (Signed)
Abnormal liver function test on the basis of alcoholic hepatitis  We have asked for the patient's liver functions which have just been obtained this week in the rehab center we will obtain copies of these results

## 2018-07-26 ENCOUNTER — Ambulatory Visit (HOSPITAL_COMMUNITY): Payer: Self-pay | Admitting: Licensed Clinical Social Worker

## 2018-07-29 ENCOUNTER — Ambulatory Visit (HOSPITAL_COMMUNITY): Payer: Self-pay | Admitting: Licensed Clinical Social Worker

## 2019-06-30 DEATH — deceased

## 2020-10-04 IMAGING — US US SCROTUM W/ DOPPLER COMPLETE
2 series · 13 of 25 positions shown · non-contrast
Comparison: None.

CLINICAL DATA: 40-year-old male with LEFT testicular pain for 4
days.

EXAM:
SCROTAL ULTRASOUND
DOPPLER ULTRASOUND OF THE TESTICLES
TECHNIQUE: Complete ultrasound examination of the testicles, epididymis, and
other scrotal structures was performed. Color and spectral Doppler
ultrasound were also utilized to evaluate blood flow to the
testicles.

[Series 1: us scrotum w/ doppler complete · 0.07mm/px · 37 acquisitions, 12 frames shown (1 of 2)]
[im 1/37]
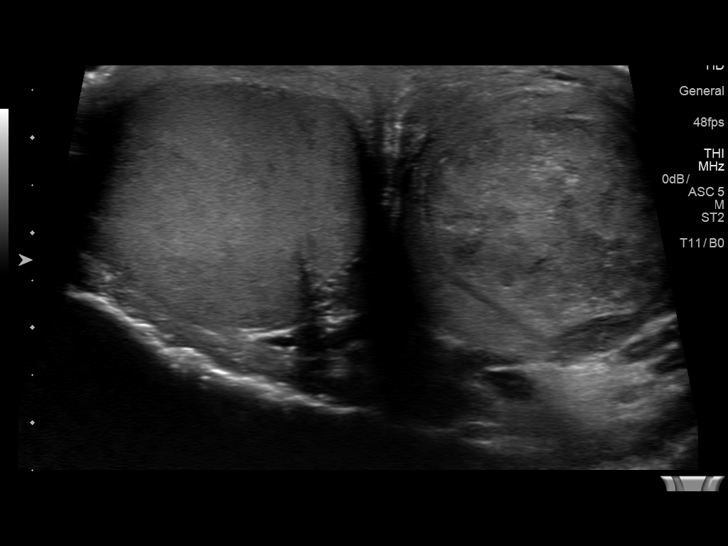
[im 4/37]
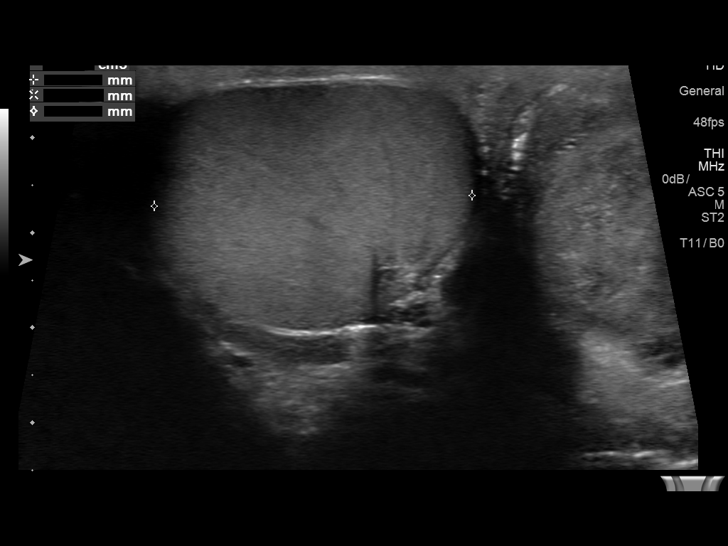
[im 7/37]
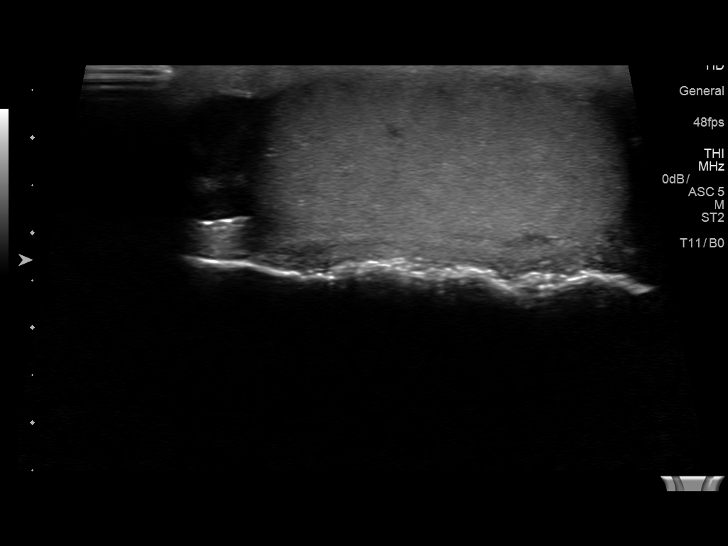
[im 10/37]
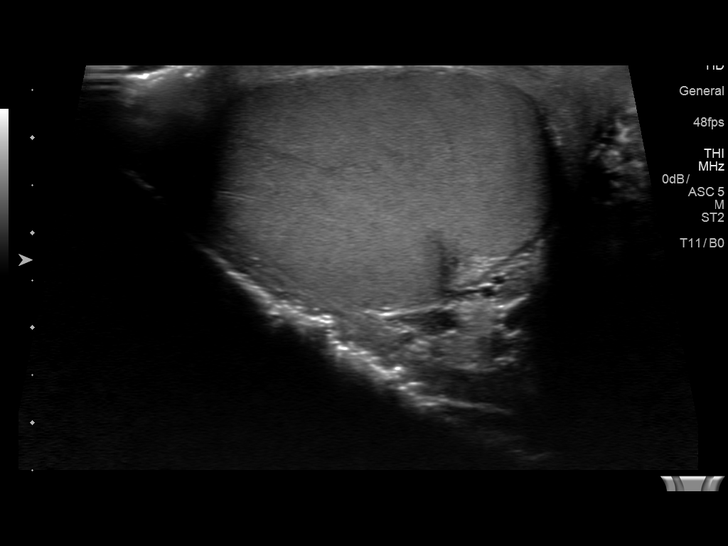
[im 13/37]
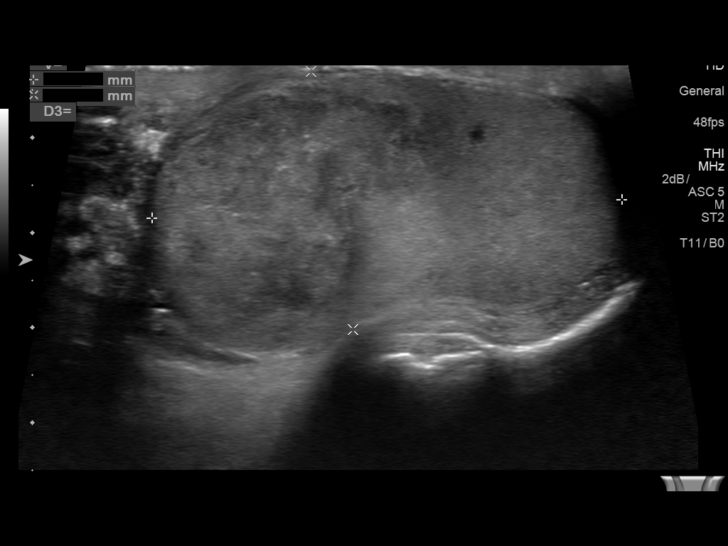
[im 16/37]
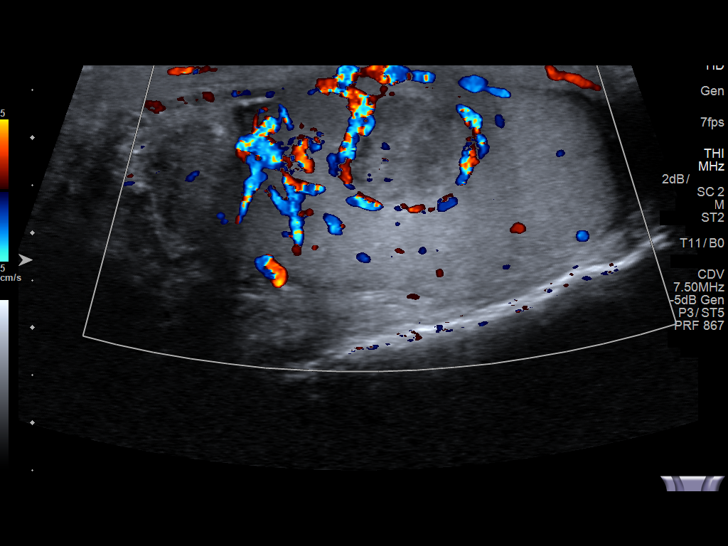
[im 19/37]
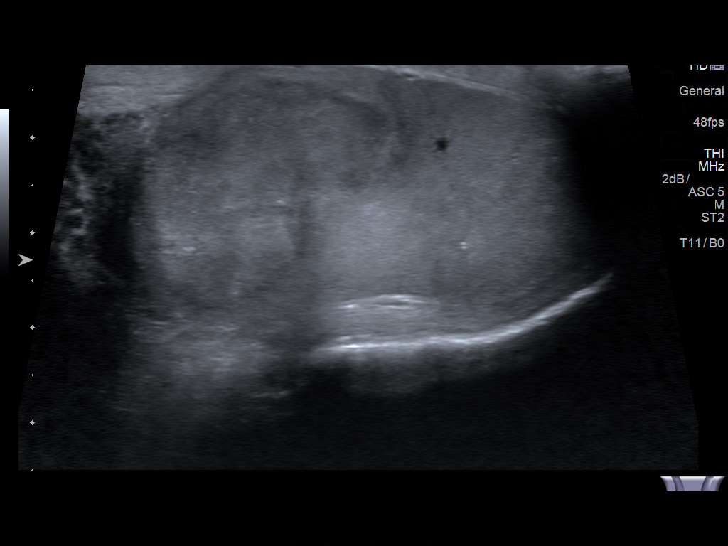
[im 22/37]
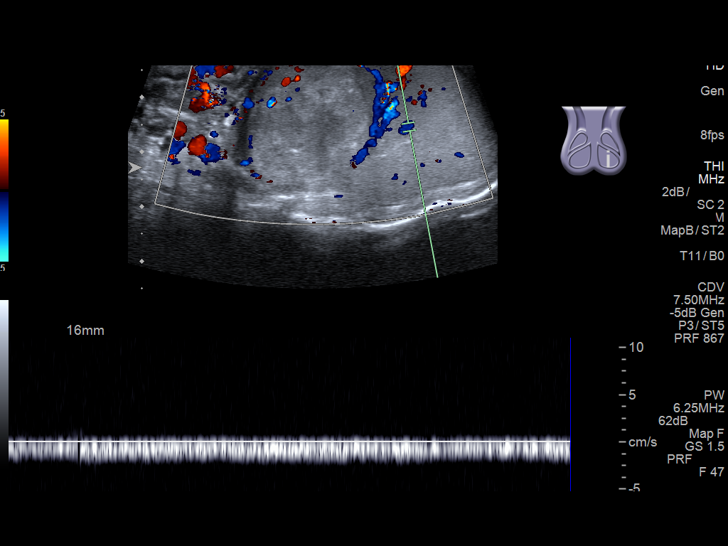
[im 26/37]
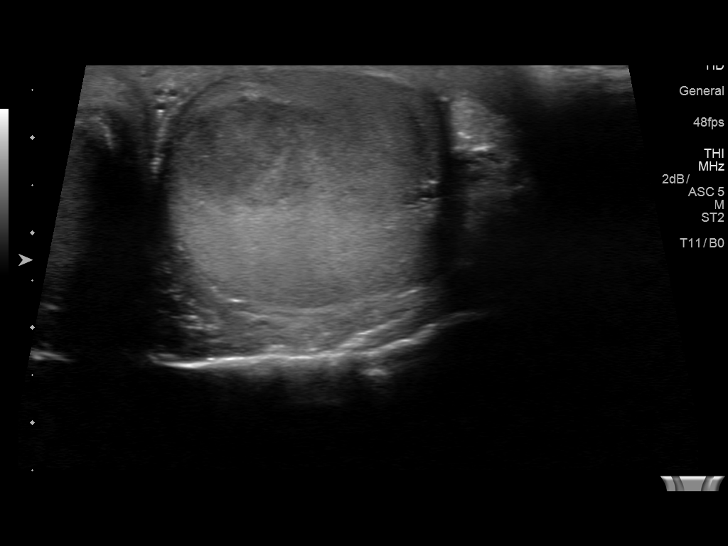
[im 29/37]
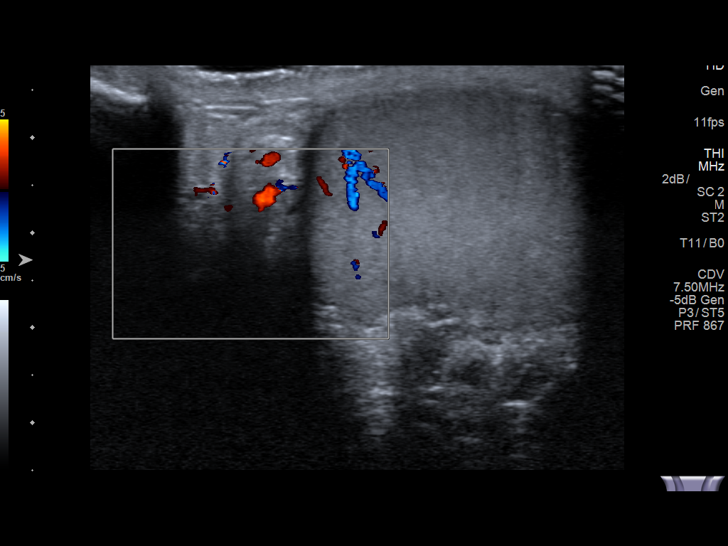
[im 32/37]
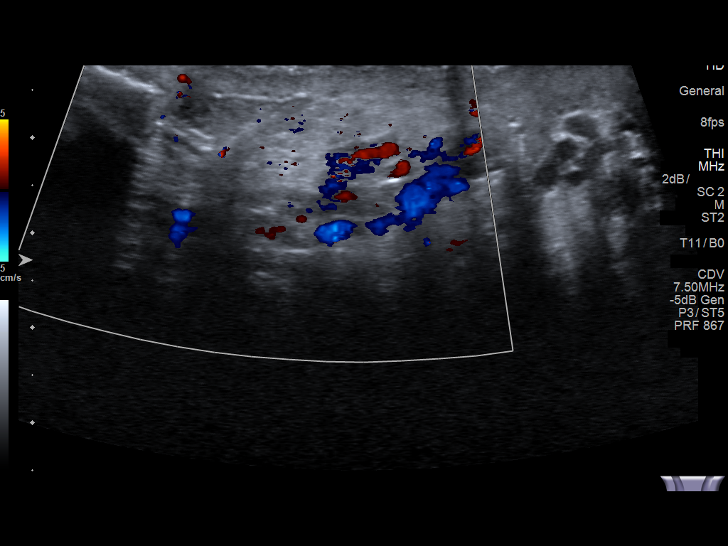
[im 35/37]
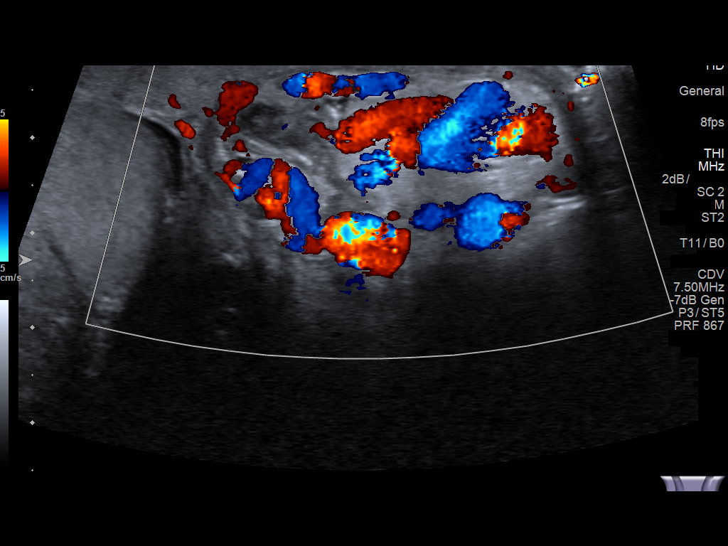

[Series 2001: us scrotum w/ doppler complete · 0.07mm/px · 1 of 1 slices shown (2 of 2)]
[im 1/1]
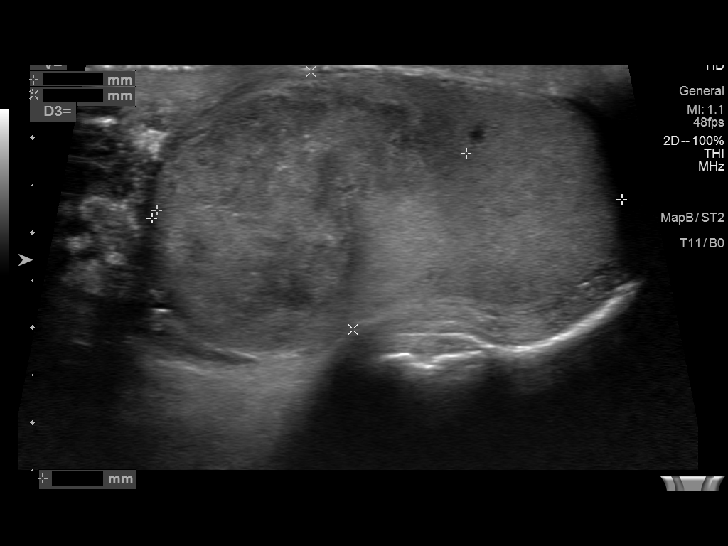

[13 of 25 positions shown; findings below may reference images not displayed]

FINDINGS: Right testicle

Measurements: 5 x 2.5 x 3.4 cm. No mass or microlithiasis
visualized.

Left testicle

Measurements: 5 x 2.8 x 3 cm. Two adjacent LEFT testicular masses
containing vascular flow are identified measuring 2.7 x 2.3 x 2.4 cm
and 1.4 x 1 x 1.5 cm. Small calcifications within these masses are
noted. There is no evidence of testicular torsion.

Right epididymis:  Normal in size and appearance.

Left epididymis:  Normal in size and appearance.

Hydrocele:  None visualized.

Varicocele:  LEFT varicocele identified.

Pulsed Doppler interrogation of both testes demonstrates normal low
resistance arterial and venous waveforms bilaterally.
IMPRESSION: 1. Two LEFT testicular masses, suspicious for neoplasm/malignancy.
Urology consultation recommended.
2. No evidence of testicular torsion.  Normal RIGHT testicle.
3. LEFT varicocele.

These results will be called to the ordering clinician or
representative by the Radiologist Assistant, and communication
documented in the PACS or zVision Dashboard.
